# Patient Record
Sex: Female | Born: 1980 | Race: White | Hispanic: No | Marital: Single | Smoking: Former smoker
Health system: Southern US, Community
[De-identification: ages and names within clinical notes are randomized; demographics above are authoritative.]

## PROBLEM LIST (undated history)

## (undated) DIAGNOSIS — K219 Gastro-esophageal reflux disease without esophagitis: Secondary | ICD-10-CM

## (undated) DIAGNOSIS — K2970 Gastritis, unspecified, without bleeding: Secondary | ICD-10-CM

## (undated) DIAGNOSIS — Z87440 Personal history of urinary (tract) infections: Secondary | ICD-10-CM

## (undated) DIAGNOSIS — M549 Dorsalgia, unspecified: Secondary | ICD-10-CM

## (undated) DIAGNOSIS — B9681 Helicobacter pylori [H. pylori] as the cause of diseases classified elsewhere: Secondary | ICD-10-CM

## (undated) DIAGNOSIS — N879 Dysplasia of cervix uteri, unspecified: Secondary | ICD-10-CM

## (undated) DIAGNOSIS — O1490 Unspecified pre-eclampsia, unspecified trimester: Secondary | ICD-10-CM

## (undated) DIAGNOSIS — D649 Anemia, unspecified: Secondary | ICD-10-CM

## (undated) DIAGNOSIS — F419 Anxiety disorder, unspecified: Secondary | ICD-10-CM

## (undated) DIAGNOSIS — F329 Major depressive disorder, single episode, unspecified: Secondary | ICD-10-CM

## (undated) DIAGNOSIS — R0600 Dyspnea, unspecified: Secondary | ICD-10-CM

## (undated) DIAGNOSIS — F32A Depression, unspecified: Secondary | ICD-10-CM

## (undated) HISTORY — DX: Major depressive disorder, single episode, unspecified: F32.9

## (undated) HISTORY — PX: FRACTURE SURGERY: SHX138

## (undated) HISTORY — PX: APPENDECTOMY: SHX54

## (undated) HISTORY — DX: Depression, unspecified: F32.A

## (undated) HISTORY — PX: MOLE REMOVAL: SHX2046

---

## 2008-07-29 ENCOUNTER — Encounter (INDEPENDENT_AMBULATORY_CARE_PROVIDER_SITE_OTHER): Payer: Self-pay | Admitting: Pediatrics

## 2008-07-29 ENCOUNTER — Other Ambulatory Visit: Admission: RE | Admit: 2008-07-29 | Discharge: 2008-07-29 | Payer: Self-pay | Admitting: Pediatrics

## 2011-01-23 ENCOUNTER — Other Ambulatory Visit (HOSPITAL_COMMUNITY): Payer: Self-pay | Admitting: Pediatrics

## 2011-01-25 ENCOUNTER — Ambulatory Visit (HOSPITAL_COMMUNITY)
Admission: RE | Admit: 2011-01-25 | Discharge: 2011-01-25 | Disposition: A | Discharge status: Home / Self Care | Payer: Medicaid Other | Source: Ambulatory Visit | Attending: Pediatrics | Admitting: Pediatrics

## 2011-01-25 ENCOUNTER — Other Ambulatory Visit (HOSPITAL_COMMUNITY): Payer: Self-pay | Admitting: Pediatrics

## 2011-01-25 DIAGNOSIS — N63 Unspecified lump in unspecified breast: Secondary | ICD-10-CM | POA: Insufficient documentation

## 2011-08-07 ENCOUNTER — Other Ambulatory Visit (HOSPITAL_COMMUNITY): Payer: Self-pay | Admitting: Pediatrics

## 2011-08-07 DIAGNOSIS — Z09 Encounter for follow-up examination after completed treatment for conditions other than malignant neoplasm: Secondary | ICD-10-CM

## 2011-08-16 ENCOUNTER — Ambulatory Visit (HOSPITAL_COMMUNITY)
Admission: RE | Admit: 2011-08-16 | Discharge: 2011-08-16 | Disposition: A | Discharge status: Home / Self Care | Payer: Medicaid Other | Source: Ambulatory Visit | Attending: Pediatrics | Admitting: Pediatrics

## 2011-08-16 DIAGNOSIS — Z09 Encounter for follow-up examination after completed treatment for conditions other than malignant neoplasm: Secondary | ICD-10-CM

## 2011-08-16 DIAGNOSIS — N63 Unspecified lump in unspecified breast: Secondary | ICD-10-CM | POA: Insufficient documentation

## 2012-08-27 ENCOUNTER — Encounter: Payer: Self-pay | Admitting: Family Medicine

## 2012-11-13 ENCOUNTER — Encounter: Payer: Self-pay | Admitting: Family Medicine

## 2012-11-13 ENCOUNTER — Ambulatory Visit (INDEPENDENT_AMBULATORY_CARE_PROVIDER_SITE_OTHER): Payer: 59 | Admitting: Family Medicine

## 2012-11-13 VITALS — BP 112/70 | HR 89 | Temp 98.7°F | Ht 67.0 in | Wt 202.8 lb

## 2012-11-13 DIAGNOSIS — R350 Frequency of micturition: Secondary | ICD-10-CM

## 2012-11-13 LAB — POCT URINALYSIS DIPSTICK
Bilirubin, UA: NEGATIVE
Ketones, UA: NEGATIVE
Nitrite, UA: NEGATIVE
pH, UA: 7.5

## 2012-11-13 MED ORDER — PHENAZOPYRIDINE HCL 200 MG PO TABS: 200.0000 mg | ORAL_TABLET | Freq: Three times a day (TID) | ORAL | Status: DC | PRN

## 2012-11-13 MED ORDER — CEPHALEXIN 500 MG PO CAPS: 500.0000 mg | ORAL_CAPSULE | Freq: Two times a day (BID) | ORAL | Status: DC

## 2012-11-13 NOTE — Patient Instructions (Signed)
Urinary Tract Infection  Urinary tract infections (UTIs) can develop anywhere along your urinary tract. Your urinary tract is your body's drainage system for removing wastes and extra water. Your urinary tract includes two kidneys, two ureters, a bladder, and a urethra. Your kidneys are a pair of bean-shaped organs. Each kidney is about the size of your fist. They are located below your ribs, one on each side of your spine.  CAUSES  Infections are caused by microbes, which are microscopic organisms, including fungi, viruses, and bacteria. These organisms are so small that they can only be seen through a microscope. Bacteria are the microbes that most commonly cause UTIs.  SYMPTOMS   Symptoms of UTIs may vary by age and gender of the patient and by the location of the infection. Symptoms in young women typically include a frequent and intense urge to urinate and a painful, burning feeling in the bladder or urethra during urination. Older women and men are more likely to be tired, shaky, and weak and have muscle aches and abdominal pain. A fever may mean the infection is in your kidneys. Other symptoms of a kidney infection include pain in your back or sides below the ribs, nausea, and vomiting.  DIAGNOSIS  To diagnose a UTI, your caregiver will ask you about your symptoms. Your caregiver also will ask to provide a urine sample. The urine sample will be tested for bacteria and white blood cells. White blood cells are made by your body to help fight infection.  TREATMENT   Typically, UTIs can be treated with medication. Because most UTIs are caused by a bacterial infection, they usually can be treated with the use of antibiotics. The choice of antibiotic and length of treatment depend on your symptoms and the type of bacteria causing your infection.  HOME CARE INSTRUCTIONS   If you were prescribed antibiotics, take them exactly as your caregiver instructs you. Finish the medication even if you feel better after you  have only taken some of the medication.   Drink enough water and fluids to keep your urine clear or pale yellow.   Avoid caffeine, tea, and carbonated beverages. They tend to irritate your bladder.   Empty your bladder often. Avoid holding urine for long periods of time.   Empty your bladder before and after sexual intercourse.   After a bowel movement, women should cleanse from front to back. Use each tissue only once.  SEEK MEDICAL CARE IF:    You have back pain.   You develop a fever.   Your symptoms do not begin to resolve within 3 days.  SEEK IMMEDIATE MEDICAL CARE IF:    You have severe back pain or lower abdominal pain.   You develop chills.   You have nausea or vomiting.   You have continued burning or discomfort with urination.  MAKE SURE YOU:    Understand these instructions.   Will watch your condition.   Will get help right away if you are not doing well or get worse.  Document Released: 10/05/2004 Document Revised: 06/27/2011 Document Reviewed: 02/03/2011  [employer] Patient Information 2014 [employer].

## 2012-11-13 NOTE — Progress Notes (Signed)
  Subjective:    Patient ID: Kelli Calhoun, female    DOB: 1980-03-08, 32 y.o.   MRN: [id number]  HPI Pt here with buring, frequency, and urgency of urination since this morning. She does not have GI sx, fever, or back pain. No h/o other UTIs.   She does relay a concern that in the past she was told she had protein in her urine and was denied insurance once becuase of it but never found out if there was a problem. She has not had a PE for several years and would like one. She worries that she has not lost her baby weight. Of note, during her last pregnancy she had HELLP syndrome.     Review of Systems per hpi     Objective:   Physical Exam  Nursing note and vitals reviewed. Constitutional: She is oriented to person, place, and time. She appears well-developed and well-nourished.   Cardiovascular: Normal rate, regular rhythm and normal heart sounds.   Pulmonary/Chest: Effort normal and breath sounds normal.  Abdominal: Soft. Bowel sounds are normal. She exhibits no distension. There is no tenderness aside from mild spt. There is no rebound. no cvat. Skin: Skin is warm and dry.  Psychiatric: She has a normal mood and affect. Her behavior is normal.        Assessment & Plan:  Urinary frequency - Plan: POCT urinalysis dipstick, Urine culture, cephALEXin (KEFLEX) 500 MG capsule, phenazopyridine (PYRIDIUM) 200 MG tablet rtc 1 week cpe and f/u on the uti as well as check for proteinuria.

## 2012-11-15 LAB — URINE CULTURE

## 2012-11-22 ENCOUNTER — Ambulatory Visit (INDEPENDENT_AMBULATORY_CARE_PROVIDER_SITE_OTHER): Payer: 59 | Admitting: Family Medicine

## 2012-11-22 ENCOUNTER — Encounter: Payer: Self-pay | Admitting: Family Medicine

## 2012-11-22 VITALS — BP 108/70 | HR 93 | Temp 98.2°F | Wt 208.4 lb

## 2012-11-22 DIAGNOSIS — Z7251 High risk heterosexual behavior: Secondary | ICD-10-CM

## 2012-11-22 DIAGNOSIS — R809 Proteinuria, unspecified: Secondary | ICD-10-CM

## 2012-11-22 DIAGNOSIS — Z0000 Encounter for general adult medical examination without abnormal findings: Secondary | ICD-10-CM

## 2012-11-22 DIAGNOSIS — E663 Overweight: Secondary | ICD-10-CM

## 2012-11-22 LAB — POCT URINALYSIS DIPSTICK
Bilirubin, UA: NEGATIVE
Glucose, UA: NEGATIVE
Ketones, UA: NEGATIVE
Leukocytes, UA: NEGATIVE
Nitrite, UA: NEGATIVE

## 2012-11-22 LAB — COMPREHENSIVE METABOLIC PANEL
Albumin: 4.3 g/dL (ref 3.5–5.2)
Alkaline Phosphatase: 59 U/L (ref 39–117)
BUN: 13 mg/dL (ref 6–23)
Creat: 0.9 mg/dL (ref 0.50–1.10)
Glucose, Bld: 103 mg/dL — ABNORMAL HIGH (ref 70–99)
Potassium: 4.1 mEq/L (ref 3.5–5.3)
Total Bilirubin: 0.2 mg/dL — ABNORMAL LOW (ref 0.3–1.2)
Total Protein: 6.9 g/dL (ref 6.0–8.3)

## 2012-11-22 LAB — STD PANEL: HIV: NONREACTIVE

## 2012-11-22 LAB — HEMOGLOBIN A1C
Hgb A1c MFr Bld: 5.4 % (ref <5.7)
Mean Plasma Glucose: 108 mg/dL (ref <117)

## 2012-11-22 LAB — VITAMIN B12: Vitamin B-12: 434 pg/mL (ref 211–911)

## 2012-11-22 LAB — CBC WITH DIFFERENTIAL/PLATELET
Basophils Absolute: 0 10*3/uL (ref 0.0–0.1)
Basophils Relative: 1 % (ref 0–1)
Eosinophils Absolute: 0.1 10*3/uL (ref 0.0–0.7)
Eosinophils Relative: 1 % (ref 0–5)
HCT: 34.3 % — ABNORMAL LOW (ref 36.0–46.0)
Hemoglobin: 11.9 g/dL — ABNORMAL LOW (ref 12.0–15.0)
MCH: 29.5 pg (ref 26.0–34.0)
MCHC: 34.7 g/dL (ref 30.0–36.0)
MCV: 84.9 fL (ref 78.0–100.0)
Monocytes Absolute: 0.6 10*3/uL (ref 0.1–1.0)
Monocytes Relative: 9 % (ref 3–12)
Neutrophils Relative %: 56 % (ref 43–77)
Platelets: 238 10*3/uL (ref 150–400)

## 2012-11-22 LAB — LDL CHOLESTEROL, DIRECT: Direct LDL: 96 mg/dL

## 2012-11-22 NOTE — Progress Notes (Signed)
Subjective:    Patient ID: Kelli Calhoun, female    DOB: 1980-02-13, 32 y.o.   MRN: [id number]  HPI Pt here for cpe, pelvic with pap and breast exam, concern over new problem of possible carpal tunnel and f/u on history of proteinuria and bieng overweight.   # Health maint - pap - today - mammo/pert FH - h/o breast lump, has u/s and mammo every 6 mos, due now - physician breast exam - today, wnl - c-scope/pert FH - no FH - dexa - no h/o steroids or fractures - BP screen - BP at goal today - cholesterol screen - DUE as has not had cehcked - fasting glucose screen -  DUE as has not had checked, FH (dad) - chlamydia screen (if female under age 21) - pt requested STD checks today - tobacco - does not smoke, quit 5 ago - alcohol - couple drinks 2x/month socially - drugs - none - Hep C (born 20-1966) - not applicable - tdap (once over age 53 in place of DT) - not sure, [facility] [place] [facility] she [facility] pregnant - DT (every 10 years until age 63, once age 72+) - 56 y from tdap - flu - had thorugh work - pneumovax - not yet - zostavax (if over 60) - did have cp, will neet at age 48 - vit D - has not had checked  Carpal Tunnel Syndrome: Patient presents for presents evaluation of possible carpal tunnel syndrome.  Onset of the symptoms was several years ago. Current symptoms include pain involving the anterior aspect of the lateral hand, of moderate severity. Inciting event/aggravating factors: work related keyboarding and worse with activity Patient's course of [id number] worsening. Evaluation to date: none.  Treatment to date: wrist splints worn at night a few years ago, which helped. apap prn..  She has a h/o proteinuria but never had a workup. No known kidney disease. No edema.   Overweight - Pt concerned about being overweight. She says she has tried diets, but they dont work. She gets minimal exercise. She drinks "some" water but has a hard time quantifying  it. She has not made an eating plan regarding cooking vs fast/prepared food and healthy snacks. She is concerned because her daughter is also becoming overweight and she'd like both of them to lose weight and be in better shape.   Review of Systems per hpi     Objective:   Physical Exam Nursing note and vitals reviewed. Constitutional: She is oriented to person, place, and time. She appears well-developed and well-nourished. overweight HENT:  Right Ear: External ear normal.  Left Ear: External ear normal.  Nose: Nose normal.  Mouth/Throat: Oropharynx is clear and moist. No oropharyngeal exudate.  Eyes: Conjunctivae are normal. Pupils are equal, round, and reactive to light.  Neck: Normal range of motion. Neck supple. No thyromegaly present.  Cardiovascular: Normal rate, regular rhythm and normal heart sounds.   Pulmonary/Chest: Effort normal and breath sounds normal. breast exam wnl - no nipple discharge, lumps, lymphadenopathy, or other concerning findings Abdominal: Soft. Bowel sounds are normal. She exhibits no distension. There is no tenderness. There is no rebound.  Lymphadenopathy:    She has no cervical adenopathy.  Neurological: She is alert and oriented to person, place, and time. She has normal reflexes.  Skin: Skin is warm and dry.  Psychiatric: She has a normal mood and affect. Her behavior is normal.  Genital - vagina, uterus, cervic wnl, minimal  discharge, no ttp on bimanual         Assessment & Plan:   Annual physical exam - Plan: POCT urinalysis dipstick, PAP, Thin Prep w/HPV rflx HPV Type 16/18 ([facility]), Comprehensive metabolic panel, GC/chlamydia probe amp, urine, Hemoglobin A1c, STD Panel (HBSAG,HIV,RPR), TSH, Urine culture, Vit D  25 hydroxy (rtn osteoporosis monitoring), Wet prep, genital  Problems related to high-risk sexual behavior - Plan: CBC with Differential, Wet prep, genital, std panel, dna probe  Proteinuria - Plan: Comprehensive metabolic panel,  UA  Overweight - Plan: Hemoglobin A1c, TSH, Vitamin B12, LDL cholesterol, direct  Possible carpal tunnel - wrist braces  F/u 2 weeks

## 2012-11-22 NOTE — Patient Instructions (Signed)

## 2012-11-23 LAB — GC/CHLAMYDIA PROBE AMP, URINE
Chlamydia, Swab/Urine, PCR: NEGATIVE
GC Probe Amp, Urine: NEGATIVE

## 2012-11-23 LAB — URINE CULTURE: Organism ID, Bacteria: 9000

## 2012-11-26 LAB — PAP, THIN PREP W/HPV RFLX HPV TYPE 16/18: HPV DNA High Risk: DETECTED — AB

## 2012-11-29 ENCOUNTER — Emergency Department (HOSPITAL_COMMUNITY)
Admission: EM | Admit: 2012-11-29 | Discharge: 2012-11-29 | Disposition: A | Discharge status: Home / Self Care | Payer: 59 | Attending: Emergency Medicine | Admitting: Emergency Medicine

## 2012-11-29 ENCOUNTER — Encounter (HOSPITAL_COMMUNITY): Payer: Self-pay | Admitting: Emergency Medicine

## 2012-11-29 DIAGNOSIS — H5710 Ocular pain, unspecified eye: Secondary | ICD-10-CM | POA: Insufficient documentation

## 2012-11-29 DIAGNOSIS — Z87891 Personal history of nicotine dependence: Secondary | ICD-10-CM | POA: Insufficient documentation

## 2012-11-29 DIAGNOSIS — G43909 Migraine, unspecified, not intractable, without status migrainosus: Secondary | ICD-10-CM | POA: Insufficient documentation

## 2012-11-29 DIAGNOSIS — Z3202 Encounter for pregnancy test, result negative: Secondary | ICD-10-CM | POA: Insufficient documentation

## 2012-11-29 DIAGNOSIS — R42 Dizziness and giddiness: Secondary | ICD-10-CM | POA: Insufficient documentation

## 2012-11-29 DIAGNOSIS — Z79899 Other long term (current) drug therapy: Secondary | ICD-10-CM | POA: Insufficient documentation

## 2012-11-29 LAB — CBC WITH DIFFERENTIAL/PLATELET
Eosinophils Absolute: 0.1 10*3/uL (ref 0.0–0.7)
Eosinophils Relative: 1 % (ref 0–5)
HCT: 34.1 % — ABNORMAL LOW (ref 36.0–46.0)
Lymphocytes Relative: 26 % (ref 12–46)
Lymphs Abs: 1.8 10*3/uL (ref 0.7–4.0)
MCH: 29.5 pg (ref 26.0–34.0)
MCV: 87.4 fL (ref 78.0–100.0)
Monocytes Absolute: 0.5 10*3/uL (ref 0.1–1.0)
Neutrophils Relative %: 65 % (ref 43–77)
RBC: 3.9 MIL/uL (ref 3.87–5.11)
RDW: 12.2 % (ref 11.5–15.5)
WBC: 7 10*3/uL (ref 4.0–10.5)

## 2012-11-29 LAB — URINALYSIS, ROUTINE W REFLEX MICROSCOPIC
Bilirubin Urine: NEGATIVE
Glucose, UA: NEGATIVE mg/dL
Ketones, ur: NEGATIVE mg/dL
Leukocytes, UA: NEGATIVE
Protein, ur: NEGATIVE mg/dL
pH: 6.5 (ref 5.0–8.0)

## 2012-11-29 LAB — BASIC METABOLIC PANEL
CO2: 29 mEq/L (ref 19–32)
Calcium: 9.1 mg/dL (ref 8.4–10.5)
Creatinine, Ser: 0.96 mg/dL (ref 0.50–1.10)
GFR calc non Af Amer: 77 mL/min — ABNORMAL LOW (ref >90)
Glucose, Bld: 100 mg/dL — ABNORMAL HIGH (ref 70–99)

## 2012-11-29 LAB — POCT PREGNANCY, URINE: Preg Test, Ur: NEGATIVE

## 2012-11-29 MED ORDER — DIPHENHYDRAMINE HCL 50 MG/ML IJ SOLN
25.0000 mg | Freq: Once | INTRAMUSCULAR | Status: AC
  Administered 2012-11-29: 25 mg via INTRAVENOUS
  Filled 2012-11-29: qty 1

## 2012-11-29 MED ORDER — METOCLOPRAMIDE HCL 5 MG/ML IJ SOLN
10.0000 mg | Freq: Once | INTRAMUSCULAR | Status: AC
Start: 2012-11-29 — End: 2012-11-29
  Administered 2012-11-29: 10 mg via INTRAVENOUS
  Filled 2012-11-29: qty 2

## 2012-11-29 MED ORDER — KETOROLAC TROMETHAMINE 30 MG/ML IJ SOLN
30.0000 mg | Freq: Once | INTRAMUSCULAR | Status: AC
  Administered 2012-11-29: 30 mg via INTRAVENOUS
  Filled 2012-11-29: qty 1

## 2012-11-29 MED ORDER — SODIUM CHLORIDE 0.9 % IV SOLN: Freq: Once | INTRAVENOUS | Status: DC

## 2012-11-29 NOTE — ED Notes (Addendum)
Headache started 2 days ago in L temporal region, radiating to neck.  Denies photophobia, but states eyes "hurt", denies neck stiffness, fever, n/v.  Dizziness started today, states it is a lightheadedness. Has had migraines before and was on Lodine as a teen.  Migraines do not appear to be linked to hormonal fluctuations.  Has tried ibuprofen 400 - 600mg  , but it has not helped.  Last taken last night. Minimal vision impact.  Denies sinus congestion.

## 2012-11-29 NOTE — Discharge Instructions (Signed)
Headaches, Frequently Asked Questions °MIGRAINE HEADACHES °Q: What is migraine? What causes it? How can I treat it? °A: Generally, migraine headaches begin as a dull ache. Then they develop into a constant, throbbing, and pulsating pain. You may experience pain at the temples. You may experience pain at the front or back of one or both sides of the head. The pain is usually accompanied by a combination of: °· Nausea. °· Vomiting. °· Sensitivity to light and noise. °Some people (about 15%) experience an aura (see below) before an attack. The cause of migraine is believed to be chemical reactions in the brain. Treatment for migraine may include over-the-counter or prescription medications. It may also include self-help techniques. These include relaxation training and biofeedback.  °Q: What is an aura? °A: About 15% of people with migraine get an "aura". This is a sign of neurological symptoms that occur before a migraine headache. You may see wavy or jagged lines, dots, or flashing lights. You might experience tunnel vision or blind spots in one or both eyes. The aura can include visual or auditory hallucinations (something imagined). It may include disruptions in smell (such as strange odors), taste or touch. Other symptoms include: °· Numbness. °· A "pins and needles" sensation. °· Difficulty in recalling or speaking the correct word. °These neurological events may last as long as 60 minutes. These symptoms will fade as the headache begins. °Q: What is a trigger? °A: Certain physical or environmental factors can lead to or "trigger" a migraine. These include: °· Foods. °· Hormonal changes. °· Weather. °· Stress. °It is important to remember that triggers are different for everyone. To help prevent migraine attacks, you need to figure out which triggers affect you. Keep a headache diary. This is a good way to track triggers. The diary will help you talk to your healthcare professional about your condition. °Q: Does  weather affect migraines? °A: Bright sunshine, hot, humid conditions, and drastic changes in barometric pressure may lead to, or "trigger," a migraine attack in some people. But studies have shown that weather does not act as a trigger for everyone with migraines. °Q: What is the link between migraine and hormones? °A: Hormones start and regulate many of your body's functions. Hormones keep your body in balance within a constantly changing environment. The levels of hormones in your body are unbalanced at times. Examples are during menstruation, pregnancy, or menopause. That can lead to a migraine attack. In fact, about three quarters of all women with migraine report that their attacks are related to the menstrual cycle.  °Q: Is there an increased risk of stroke for migraine sufferers? °A: The likelihood of a migraine attack causing a stroke is very remote. That is not to say that migraine sufferers cannot have a stroke associated with their migraines. In persons under age 40, the most common associated factor for stroke is migraine headache. But over the course of a person's normal life span, the occurrence of migraine headache may actually be associated with a reduced risk of dying from cerebrovascular disease due to stroke.  °Q: What are acute medications for migraine? °A: Acute medications are used to treat the pain of the headache after it has started. Examples over-the-counter medications, NSAIDs, ergots, and triptans.  °Q: What are the triptans? °A: Triptans are the newest class of abortive medications. They are specifically targeted to treat migraine. Triptans are vasoconstrictors. They moderate some chemical reactions in the brain. The triptans work on receptors in your brain. Triptans help   to restore the balance of a neurotransmitter called serotonin. Fluctuations in levels of serotonin are thought to be a main cause of migraine.  °Q: Are over-the-counter medications for migraine effective? °A:  Over-the-counter, or "OTC," medications may be effective in relieving mild to moderate pain and associated symptoms of migraine. But you should see your caregiver before beginning any treatment regimen for migraine.  °Q: What are preventive medications for migraine? °A: Preventive medications for migraine are sometimes referred to as "prophylactic" treatments. They are used to reduce the frequency, severity, and length of migraine attacks. Examples of preventive medications include antiepileptic medications, antidepressants, beta-blockers, calcium channel blockers, and NSAIDs (nonsteroidal anti-inflammatory drugs). °Q: Why are anticonvulsants used to treat migraine? °A: During the past few years, there has been an increased interest in antiepileptic drugs for the prevention of migraine. They are sometimes referred to as "anticonvulsants". Both epilepsy and migraine may be caused by similar reactions in the brain.  °Q: Why are antidepressants used to treat migraine? °A: Antidepressants are typically used to treat people with depression. They may reduce migraine frequency by regulating chemical levels, such as serotonin, in the brain.  °Q: What alternative therapies are used to treat migraine? °A: The term "alternative therapies" is often used to describe treatments considered outside the scope of conventional Western medicine. Examples of alternative therapy include acupuncture, acupressure, and yoga. Another common alternative treatment is herbal therapy. Some herbs are believed to relieve headache pain. Always discuss alternative therapies with your caregiver before proceeding. Some herbal products contain arsenic and other toxins. °TENSION HEADACHES °Q: What is a tension-type headache? What causes it? How can I treat it? °A: Tension-type headaches occur randomly. They are often the result of temporary stress, anxiety, fatigue, or anger. Symptoms include soreness in your temples, a tightening band-like sensation  around your head (a "vice-like" ache). Symptoms can also include a pulling feeling, pressure sensations, and contracting head and neck muscles. The headache begins in your forehead, temples, or the back of your head and neck. Treatment for tension-type headache may include over-the-counter or prescription medications. Treatment may also include self-help techniques such as relaxation training and biofeedback. °CLUSTER HEADACHES °Q: What is a cluster headache? What causes it? How can I treat it? °A: Cluster headache gets its name because the attacks come in groups. The pain arrives with little, if any, warning. It is usually on one side of the head. A tearing or bloodshot eye and a runny nose on the same side of the headache may also accompany the pain. Cluster headaches are believed to be caused by chemical reactions in the brain. They have been described as the most severe and intense of any headache type. Treatment for cluster headache includes prescription medication and oxygen. °SINUS HEADACHES °Q: What is a sinus headache? What causes it? How can I treat it? °A: When a cavity in the bones of the face and skull (a sinus) becomes inflamed, the inflammation will cause localized pain. This condition is usually the result of an allergic reaction, a tumor, or an infection. If your headache is caused by a sinus blockage, such as an infection, you will probably have a fever. An x-ray will confirm a sinus blockage. Your caregiver's treatment might include antibiotics for the infection, as well as antihistamines or decongestants.  °REBOUND HEADACHES °Q: What is a rebound headache? What causes it? How can I treat it? °A: A pattern of taking acute headache medications too often can lead to a condition known as "rebound headache."   A pattern of taking too much headache medication includes taking it more than 2 days per week or in excessive amounts. That means more than the label or a caregiver advises. With rebound  headaches, your medications not only stop relieving pain, they actually begin to cause headaches. Doctors treat rebound headache by tapering the medication that is being overused. Sometimes your caregiver will gradually substitute a different type of treatment or medication. Stopping may be a challenge. Regularly overusing a medication increases the potential for serious side effects. Consult a caregiver if you regularly use headache medications more than 2 days per week or more than the label advises. °ADDITIONAL QUESTIONS AND ANSWERS °Q: What is biofeedback? °A: Biofeedback is a self-help treatment. Biofeedback uses special equipment to monitor your body's involuntary physical responses. Biofeedback monitors: °· Breathing. °· Pulse. °· Heart rate. °· Temperature. °· Muscle tension. °· Brain activity. °Biofeedback helps you refine and perfect your relaxation exercises. You learn to control the physical responses that are related to stress. Once the technique has been mastered, you do not need the equipment any more. °Q: Are headaches hereditary? °A: Four out of five (80%) of people that suffer report a family history of migraine. Scientists are not sure if this is genetic or a family predisposition. Despite the uncertainty, a child has a 50% chance of having migraine if one parent suffers. The child has a 75% chance if both parents suffer.  °Q: Can children get headaches? °A: By the time they reach high school, most young people have experienced some type of headache. Many safe and effective approaches or medications can prevent a headache from occurring or stop it after it has begun.  °Q: What type of doctor should I see to diagnose and treat my headache? °A: Start with your primary caregiver. Discuss his or her experience and approach to headaches. Discuss methods of classification, diagnosis, and treatment. Your caregiver may decide to recommend you to a headache specialist, depending upon your symptoms or other  physical conditions. Having diabetes, allergies, etc., may require a more comprehensive and inclusive approach to your headache. The [employer] will provide, upon request, a list of NHF physician members in your state. °Document Released: 03/18/2003 Document Revised: 03/20/2011 Document Reviewed: 08/26/2007 °[employer]® Patient Information ©2014 [employer]. ° °Migraine Headache °A migraine headache is very bad, throbbing pain on one or both sides of your head. Talk to your doctor about what things may bring on (trigger) your migraine headaches. °HOME CARE °· Only take medicines as told by your doctor. °· Lie down in a dark, quiet room when you have a migraine. °· Keep a journal to find out if certain things bring on migraine headaches. For example, write down: °· What you eat and drink. °· How much sleep you get. °· Any change to your diet or medicines. °· Lessen how much alcohol you drink. °· Quit smoking if you smoke. °· Get enough sleep. °· Lessen any stress in your life. °· Keep lights dim if bright lights bother you or make your migraines worse. °GET HELP RIGHT AWAY IF:  °· Your migraine becomes really bad. °· You have a fever. °· You have a stiff neck. °· You have trouble seeing. °· Your muscles are weak, or you lose muscle control. °· You lose your balance or have trouble walking. °· You feel like you will pass out (faint), or you pass out. °· You have really bad symptoms that are different than your first symptoms. °MAKE SURE   YOU:   Understand these instructions.  Will watch your condition.  Will get help right away if you are not doing well or get worse. Document Released: 10/05/2007 Document Revised: 03/20/2011 Document Reviewed: 12/16/2010 [facility] Patient Information 2014 [place], [place].

## 2012-11-29 NOTE — ED Provider Notes (Signed)
CSN: [id number]     Arrival date & time 11/29/12  4098 History   First MD Initiated Contact with Patient 11/29/12 [phone]     Chief Complaint  Patient presents with  . Migraine  . Dizziness   (Consider location/radiation/quality/duration/timing/severity/associated sxs/prior Treatment) Patient is a 32 y.o. female presenting with headaches. The history is provided by the patient.  Headache Pain location:  L parietal, L temporal and frontal Quality:  Dull Radiates to:  L neck Severity at highest:  9/10 Onset quality:  Gradual Duration:  2 days Timing:  Constant Progression:  Unchanged Chronicity:  Recurrent Similar to prior headaches: yes   Context: not activity, not exposure to bright light, not stress and not intercourse   Relieved by:  Nothing Worsened by:  Activity Ineffective treatments:  NSAIDs Associated symptoms: dizziness and eye pain   Associated symptoms: no abdominal pain, no back pain, no blurred vision, no congestion, no cough, no drainage, no facial pain, no fatigue, no fever, no focal weakness, no hearing loss, no loss of balance, no nausea, no near-syncope, no neck pain, no neck stiffness, no numbness, no paresthesias, no photophobia, no seizures, no sinus pressure, no sore throat, no swollen glands, no syncope, no URI, no visual change, no vomiting and no weakness   Risk factors: no family hx of SAH     History reviewed. No pertinent past medical history. Past Surgical History  Procedure Laterality Date  . Appendectomy    . Fracture surgery    . Mole removal     History reviewed. No pertinent family history. History  Substance Use Topics  . Smoking status: Former Games developer  . Smokeless tobacco: Not on file  . Alcohol Use: Yes     Comment: socially   OB History   Grav Para Term Preterm Abortions TAB SAB Ect Mult Living                 Review of Systems  Constitutional: Negative for fever, activity change, appetite change and fatigue.  HENT: Negative for  congestion, facial swelling, hearing loss, postnasal drip, sinus pressure, sore throat and trouble swallowing.   Eyes: Positive for pain. Negative for blurred vision, photophobia and visual disturbance.  Respiratory: Negative for cough and shortness of breath.   Cardiovascular: Negative for chest pain, syncope and near-syncope.  Gastrointestinal: Negative for nausea, vomiting and abdominal pain.  Musculoskeletal: Negative for back pain, neck pain and neck stiffness.  Skin: Negative for rash and wound.  Neurological: Positive for dizziness and headaches. Negative for focal weakness, seizures, facial asymmetry, speech difficulty, weakness, numbness, paresthesias and loss of balance.  Psychiatric/Behavioral: Negative for confusion and decreased concentration.  All other systems reviewed and are negative.    Allergies  Review of patient's allergies indicates no known allergies.  Home Medications   Current Outpatient Rx  Name  Route  Sig  Dispense  Refill  . ibuprofen (ADVIL,MOTRIN) 200 MG tablet   Oral   Take 600 mg by mouth daily as needed for mild pain or moderate pain.         . methylphenidate (RITALIN) 10 MG tablet   Oral   Take 10 mg by mouth 2 (two) times daily.          BP 136/77  Pulse 77  Temp(Src) 98.3 F (36.8 C) (Oral)  Resp 18  Ht 5\' 7"  (1.702 m)  Wt 206 lb (93.441 kg)  BMI 32.26 kg/m2  SpO2 100%  LMP 11/25/2012 Physical Exam  Nursing note and  vitals reviewed. Constitutional: She is oriented to person, place, and time. She appears well-developed and well-nourished. No distress.  HENT:  Head: Normocephalic and atraumatic.  Mouth/Throat: Oropharynx is clear and moist.  Eyes: Conjunctivae and EOM are normal. Pupils are equal, round, and reactive to light.  Neck: Normal range of motion and phonation normal. Neck supple. No spinous process tenderness and no muscular tenderness present. No rigidity. No Brudzinski's sign and no Kernig's sign noted. No thyromegaly  present.  Cardiovascular: Normal rate, regular rhythm, normal heart sounds and intact distal pulses.   No murmur heard. Pulmonary/Chest: Effort normal and breath sounds normal. No respiratory distress.  Musculoskeletal: Normal range of motion.  Lymphadenopathy:    She has no cervical adenopathy.  Neurological: She is alert and oriented to person, place, and time. She has normal strength. No cranial nerve deficit or sensory deficit. She exhibits normal muscle tone. Coordination and gait normal. GCS eye subscore is 4. GCS verbal subscore is 5. GCS motor subscore is 6.  Reflex Scores:      Tricep reflexes are 2+ on the right side and 2+ on the left side.      Bicep reflexes are 2+ on the right side and 2+ on the left side.      Patellar reflexes are 2+ on the right side and 2+ on the left side.      Achilles reflexes are 2+ on the right side and 2+ on the left side. Skin: Skin is warm and dry.  Psychiatric: She has a normal mood and affect.    ED Course  Procedures (including critical care time) Labs Review Labs Reviewed  CBC WITH DIFFERENTIAL - Abnormal; Notable for the following:    Hemoglobin 11.5 (*)    HCT 34.1 (*)    All other components within normal limits  BASIC METABOLIC PANEL - Abnormal; Notable for the following:    Potassium 3.4 (*)    Glucose, Bld 100 (*)    GFR calc non Af Amer 77 (*)    GFR calc Af Amer 89 (*)    All other components within normal limits  URINALYSIS, ROUTINE W REFLEX MICROSCOPIC - Abnormal; Notable for the following:    Hgb urine dipstick TRACE (*)    All other components within normal limits  URINE MICROSCOPIC-ADD ON  POCT PREGNANCY, URINE   Imaging Review No results found.  EKG Interpretation   None       MDM   Vitals stable,  Pt is non-toxic appearing.  No focal neuro deficits, no meningeal signs.  Headache of gradual onset that is similar to previous.    Labs reviewed and discussed with the patient.  She is feeling better and reports  that the headache and dizziness have completely resolved.  She is ambulatory with a steady gait and requesting discharge  Patient agrees to close f/u with PMD or to return here if the symptoms worsen.  Appears stable for discharge  Winifred Balogh L. Trisha Mangle, PA-C 11/30/12 1514

## 2012-12-01 NOTE — ED Provider Notes (Signed)
Medical screening examination/treatment/procedure(s) were performed by non-physician practitioner and as supervising physician I was immediately available for consultation/collaboration.  EKG Interpretation   None         [name] L Manish Ruggiero, MD 12/01/12 0716 

## 2012-12-04 ENCOUNTER — Ambulatory Visit (INDEPENDENT_AMBULATORY_CARE_PROVIDER_SITE_OTHER): Payer: 59 | Admitting: Family Medicine

## 2012-12-04 ENCOUNTER — Encounter: Payer: Self-pay | Admitting: Family Medicine

## 2012-12-04 VITALS — BP 112/68 | HR 88 | Temp 97.3°F | Resp 20 | Ht 65.0 in | Wt 203.0 lb

## 2012-12-04 DIAGNOSIS — G5600 Carpal tunnel syndrome, unspecified upper limb: Secondary | ICD-10-CM

## 2012-12-04 DIAGNOSIS — D649 Anemia, unspecified: Secondary | ICD-10-CM

## 2012-12-04 DIAGNOSIS — Z87448 Personal history of other diseases of urinary system: Secondary | ICD-10-CM

## 2012-12-04 DIAGNOSIS — IMO0002 Reserved for concepts with insufficient information to code with codable children: Secondary | ICD-10-CM

## 2012-12-04 DIAGNOSIS — E663 Overweight: Secondary | ICD-10-CM

## 2012-12-04 DIAGNOSIS — L989 Disorder of the skin and subcutaneous tissue, unspecified: Secondary | ICD-10-CM

## 2012-12-04 DIAGNOSIS — B977 Papillomavirus as the cause of diseases classified elsewhere: Secondary | ICD-10-CM

## 2012-12-04 DIAGNOSIS — N920 Excessive and frequent menstruation with regular cycle: Secondary | ICD-10-CM

## 2012-12-04 DIAGNOSIS — R6889 Other general symptoms and signs: Secondary | ICD-10-CM

## 2012-12-04 MED ORDER — NORGESTIM-ETH ESTRAD TRIPHASIC 0.18/0.215/0.25 MG-35 MCG PO TABS: 1.0000 | ORAL_TABLET | Freq: Every day | ORAL | Status: DC

## 2012-12-04 NOTE — Progress Notes (Signed)
Subjective:    Patient ID: Kelli Calhoun, female    DOB: [birth year], 32 y.o.   MRN: [id number]  HPI Pt here for 3 chronic issues, 1 now resolved issue, and skin biopsies.  Anemia - seen on cbc, has felt fatigued recemtly. Has had heavier periods since her 3rd child, more bleeding, more cramps. She had this before kids but was on ocps which helped. Now her bf has had a vasectomy so she has not been on [facility].   Carpal tunnel - braces have not helped. Still weakness and pain in hands and forearms.   Overweight - working on diet and exercise, plans to be more aggressive with this after the holidays.   Proteinuria - resolved  Concerning skin lesions - 1 skin tag on chest, irritates her, gets cuaght on clothing and bleeds. She requests removal - 4 concerning skin lesions on abdominal area - none irritating or itching but she thinks all have enlarged and edges have become blurrier.   Review of Systems     Objective:   Physical Exam Nursing note and vitals reviewed. Constitutional: She is oriented to person, place, and time. She appears well-developed and well-nourished.  HENT:  Right Ear: External ear normal.  Left Ear: External ear normal.  Nose: Nose normal.  Mouth/Throat: Oropharynx is clear and moist. No oropharyngeal exudate.  Eyes: Conjunctivae are normal. Pupils are equal, round, and reactive to light.  Neck: Normal range of motion. Neck supple. No thyromegaly present.  Cardiovascular: Normal rate, regular rhythm and normal heart sounds.   Pulmonary/Chest: Effort normal and breath sounds normal.  Abdominal: Soft. Bowel sounds are normal. She exhibits no distension. There is no tenderness. There is no rebound.  Lymphadenopathy:    She has no cervical adenopathy.  Neurological: She is alert and oriented to person, place, and time. She has normal reflexes. phalens and tinnels neg but some flattening of thenar eminance noted on left hand.  Skin: Skin is warm and dry. Skin tag - 5mm long,  dry blood on it, on chest Skin lesion 1 - blurred edges, 4mm diameter, rasied center, variations in darkness, right hip Skin lesion 2 -  blurred edges, 6mm diameter, rasied center, variations in darkness, right lower abd Skin lesion 3 -  blurred edges, 7mm diameter, rasied center, variations in darkness, left lower abd Skin lesion 4 -   blurred edges, 4mm diameter, rasied center, variations in darkness, left hip Psychiatric: She has a normal mood and affect. Her behavior is normal.         Assessment & Plan:  Anemia - likely due to heavy periods, restart ocps and start iron, recheck in 6 most  Carpal tunnel - see hand surg, referral placed  Overweight - pt to work on diet and exercise on her own once holidays are through and rtc in 6 mos for follow up Dietary management, education, guidance, and counseling provided  hpv on pap - re-pap in 1 year.   Concerning skin lesions -  4 punch biopsies and 1 skin tag removal  Skin Tag Removal Procedure Note  Pre-operative Diagnosis: Classic skin tags (acrochordon)  Post-operative Diagnosis: Classic skin tags (acrochordon)  Locations:left chest  Indications: irritation, bleeding  Anesthesia: Lidocaine 1% with epinephrine with added sodium bicarbonate  Procedure Details  The risks (including bleeding and infection) and benefits of the procedure and Verbal informed consent obtained. Using sterile iris scissors, multiple skin tags were snipped off at their bases after cleansing with Betadine.  Bleeding was controlled by  pressure.   Findings: Pathognomonic benign lesions  not sent for pathological exam.  Condition: Stable  Complications: none.  Plan: 1. Instructed to keep the wounds dry and covered for 24-48h and clean thereafter. 2. Warning signs of infection were reviewed.   3. Recommended that the patient use OTC acetaminophen as needed for pain.  4. Return as needed.  Punch Biopsy Procedure Note  Pre-operative Diagnosis:  Dysplastic nevi, Suspicious lesion  Post-operative Diagnosis: same  Locations: right hip, llq, rlq, left hip  Indications: r/o skin cancer  Anesthesia: Lidocaine 1% with epinephrine with added sodium bicarbonate  Procedure Details  History of allergy to iodine: no Patient informed of the risks (including bleeding and infection) and benefits of the  procedure and Verbal informed consent obtained.  The lesions and surrounding areas were given a sterile prep using betadyne and draped in the usual sterile fashion. The skin was then stretched perpendicular to the skin tension lines and the lesion removed using the 3 mm punch. The resulting ellipse was then closed.  The specimens were sent for pathologic examination. The patient tolerated the procedure well.  EBL: <2cc total ml  Findings: none  Condition: Stable  Complications: none.  Plan: 1. Instructed to keep the wound dry and covered for 24-48h and clean thereafter. 2. Warning signs of infection were reviewed.   3. Recommended that the patient use OTC acetaminophen as needed for pain.

## 2012-12-04 NOTE — Patient Instructions (Signed)
Anemia - start iron daily, try ferrous gluconate 1-2 times daily  Biopsy Care After Refer to this sheet in the next few weeks. These instructions provide you with information on caring for yourself after your procedure. Your caregiver may also give you more specific instructions. Your treatment has been planned according to current medical practices, but problems sometimes occur. Call your caregiver if you have any problems or questions after your procedure. If you had a fine needle biopsy, you may have soreness at the biopsy site for 1 to 2 days. If you had an open biopsy, you may have soreness at the biopsy site for 3 to 4 days. HOME CARE INSTRUCTIONS   You may resume normal diet and activities as directed.  Change bandages (dressings) as directed. If your wound was closed with a skin glue (adhesive), it will wear off and begin to peel in 7 days.  Only take over-the-counter or prescription medicines for pain, discomfort, or fever as directed by your caregiver.  Ask your caregiver when you can bathe and get your wound wet. SEEK IMMEDIATE MEDICAL CARE IF:   You have increased bleeding (more than a small spot) from the biopsy site.  You notice redness, swelling, or increasing pain at the biopsy site.  You have pus coming from the biopsy site.  You have a fever.  You notice a bad smell coming from the biopsy site or dressing.  You have a rash, have difficulty breathing, or have any allergic problems. MAKE SURE YOU:   Understand these instructions.  Will watch your condition.  Will get help right away if you are not doing well or get worse. Document Released: 07/15/2004 Document Revised: 03/20/2011 Document Reviewed: 06/23/2010 [facility] Patient Information 2014 [place], [place].

## 2012-12-04 NOTE — Addendum Note (Signed)
Addended by: Acey Lav on: 12/04/2012 04:16 PM   Modules accepted: Orders

## 2012-12-11 ENCOUNTER — Telehealth: Payer: Self-pay | Admitting: *Deleted

## 2012-12-11 NOTE — Telephone Encounter (Signed)
Message copied by [facility] [place] [facility] on Wed Dec 11, 2012  4:16 PM ------      Message from: Acey Lav      Created: Wed Dec 11, 2012  1:36 PM       Please let pt know her labs came back. We'll need to repeat her pap in 1 year due to hpv positive and she is slightly anemic. Does she have heavy periods? Thanks AW ------

## 2012-12-11 NOTE — Telephone Encounter (Signed)
Nurse notified pt and she stated that she was already aware of results that MD had discussed those with her in office and that she does have heavy periods. Will route to MD.

## 2012-12-18 ENCOUNTER — Encounter: Payer: Self-pay | Admitting: Family Medicine

## 2012-12-18 ENCOUNTER — Ambulatory Visit (INDEPENDENT_AMBULATORY_CARE_PROVIDER_SITE_OTHER): Payer: 59 | Admitting: Family Medicine

## 2012-12-18 VITALS — BP 104/68 | HR 83 | Temp 98.2°F | Resp 20 | Ht 65.0 in | Wt 203.2 lb

## 2012-12-18 DIAGNOSIS — G459 Transient cerebral ischemic attack, unspecified: Secondary | ICD-10-CM

## 2012-12-18 DIAGNOSIS — G5600 Carpal tunnel syndrome, unspecified upper limb: Secondary | ICD-10-CM

## 2012-12-18 DIAGNOSIS — G2581 Restless legs syndrome: Secondary | ICD-10-CM | POA: Insufficient documentation

## 2012-12-18 DIAGNOSIS — E663 Overweight: Secondary | ICD-10-CM | POA: Insufficient documentation

## 2012-12-18 DIAGNOSIS — N920 Excessive and frequent menstruation with regular cycle: Secondary | ICD-10-CM

## 2012-12-18 NOTE — Patient Instructions (Signed)

## 2012-12-18 NOTE — Progress Notes (Signed)
Subjective:    Patient ID: Kelli Calhoun, female    DOB: 1980/07/01, 32 y.o.   MRN: [id number]  HPI Kelli Calhoun is here today for f/u on a few chronic concerns as well as to discuss a new concern of possible TIA.  Overweight - is trying to maintain weight throughout holidays. After holidays will work on losing weight.   Carpel tunnel - has been referred to hand surgeon for eval but hasnt had appt schedule yet.  Heavy periods - mild anemia, hasnt started OCPs yet bc wants to wait and start on Sunday after her period.   RLS - not on any meds, but she notices esp at night her legs need to move.  Pt had an episode of complete disorientation 1 week ago that she is woried about. She was stocking a shelf at work and when she got something off a cart and went to put it on the shelf, and suddenly realized her orientation was the wrong way. For a few seconds she was completely confused where things were.  She also had tunnel vision which "zoomed in" where she was looking. She denies dizziness, weakness, palpittions, pain, or new foods/routine. This occurred at 3am which is a normal time for the pt to be working. She does have a history of headaches and a FH of stroke - dad had several strokes starting around age 36. He had had HTN, was diabetic and smoked.   Review of Systems per hpi     Objective:   Physical Exam  Neurological: She is alert. She has normal strength and normal reflexes. No cranial nerve deficit or sensory deficit. She displays a negative Romberg sign. Coordination and gait normal. GCS eye subscore is 4. GCS verbal subscore is 5. GCS motor subscore is 6.  Rapid alternating movements, finger nose, heel-toe walk all wnl     Nursing note and vitals reviewed. Constitutional: She is oriented to person, place, and time. She appears well-developed and well-nourished.  HENT:  Right Ear: External ear normal.  Left Ear: External ear normal.  Nose: Nose normal.  Mouth/Throat: Oropharynx is  clear and moist. No oropharyngeal exudate.  Eyes: Conjunctivae are normal. Pupils are equal, round, and reactive to light.  Neck: Normal range of motion. Neck supple. No thyromegaly present.  Cardiovascular: Normal rate, regular rhythm and normal heart sounds.   Pulmonary/Chest: Effort normal and breath sounds normal.  Abdominal: Soft. Bowel sounds are normal. She exhibits no distension. There is no tenderness. There is no rebound.  Lymphadenopathy:    She has no cervical adenopathy.  Neurological: She is alert and oriented to person, place, and time. She has normal reflexes.  Skin: Skin is warm and dry.  Psychiatric: She has a normal mood and affect. Her behavior is normal.        Assessment & Plan:  TIA (transient ischemic attack) - Plan: US Carotid Duplex Bilateral, EKG 12-Lead, MR Brain Wo Contrast - pt said she did not have time for ecg, will try to get it done at [facility] tomorrow along with other studies and if to able she can rtc tomorrow and we will do ecg. - if ecg wnl will need holter monitor  Overweight - ok to wait till after holidays  Restless leg syndrome - start ferrous gluconate, suspect related to iron deficiency due to anemia from heavy periods  Carpal tunnel syndrome, unspecified laterality - will sort out referral and make sure she gets set up with appt  Heavy periods - OCPs,  iron supplements  If new neuro sx arise she should go to the ED. Will see how these studies look and go fro there. Otherwise rtc 3 mos.

## 2012-12-20 ENCOUNTER — Ambulatory Visit (HOSPITAL_COMMUNITY)
Admission: RE | Admit: 2012-12-20 | Discharge: 2012-12-20 | Disposition: A | Discharge status: Home / Self Care | Payer: 59 | Source: Ambulatory Visit | Attending: Family Medicine | Admitting: Family Medicine

## 2012-12-20 ENCOUNTER — Ambulatory Visit (HOSPITAL_COMMUNITY): Payer: 59

## 2012-12-20 DIAGNOSIS — G459 Transient cerebral ischemic attack, unspecified: Secondary | ICD-10-CM | POA: Insufficient documentation

## 2012-12-20 DIAGNOSIS — Q761 Klippel-Feil syndrome: Secondary | ICD-10-CM | POA: Insufficient documentation

## 2012-12-25 ENCOUNTER — Ambulatory Visit (INDEPENDENT_AMBULATORY_CARE_PROVIDER_SITE_OTHER): Payer: 59 | Admitting: Family Medicine

## 2012-12-25 ENCOUNTER — Other Ambulatory Visit: Payer: Self-pay | Admitting: Family Medicine

## 2012-12-25 ENCOUNTER — Telehealth: Payer: Self-pay | Admitting: *Deleted

## 2012-12-25 DIAGNOSIS — N63 Unspecified lump in unspecified breast: Secondary | ICD-10-CM

## 2012-12-25 DIAGNOSIS — Z09 Encounter for follow-up examination after completed treatment for conditions other than malignant neoplasm: Secondary | ICD-10-CM

## 2012-12-25 DIAGNOSIS — G459 Transient cerebral ischemic attack, unspecified: Secondary | ICD-10-CM

## 2012-12-25 DIAGNOSIS — Z0189 Encounter for other specified special examinations: Secondary | ICD-10-CM

## 2012-12-25 NOTE — Telephone Encounter (Signed)
Pt notified and appreciative. Informed her that we would notify her with an update on prior authorizations and when CT would be scheduled.

## 2012-12-25 NOTE — Telephone Encounter (Signed)
Message copied by [facility], [place] [facility] on Wed Dec 25, 2012 11:30 AM ------      Message from: Acey Lav      Created: Wed Dec 25, 2012 11:11 AM       Please let pt know mri showed no stroke, no evidence of bleeding, no masses (ie no tumor). All great news. It did show some slight abnormalities in the top of her cervical spine that probably are not a problem but should be evaluated further. We'll do this with a CT which Im ordering now. Ill let her know the result when I get it. For now she shouldn't worry - these abnormalities have likley been there her whole life and we only now are seeing them bc we did imaging for another reason. Thanks AW ------

## 2012-12-25 NOTE — Progress Notes (Signed)
See telephone encounter.

## 2012-12-26 NOTE — Progress Notes (Signed)
Pt came in for ordered EKG. EKG done and results given to MD.

## 2013-01-08 ENCOUNTER — Ambulatory Visit: Payer: 59 | Admitting: Orthopedic Surgery

## 2013-01-15 ENCOUNTER — Telehealth: Payer: Self-pay | Admitting: Family Medicine

## 2013-01-15 ENCOUNTER — Ambulatory Visit (HOSPITAL_COMMUNITY)
Admission: RE | Admit: 2013-01-15 | Discharge: 2013-01-15 | Disposition: A | Discharge status: Home / Self Care | Payer: 59 | Source: Ambulatory Visit | Attending: Family Medicine | Admitting: Family Medicine

## 2013-01-15 DIAGNOSIS — R928 Other abnormal and inconclusive findings on diagnostic imaging of breast: Secondary | ICD-10-CM | POA: Insufficient documentation

## 2013-01-15 DIAGNOSIS — Z09 Encounter for follow-up examination after completed treatment for conditions other than malignant neoplasm: Secondary | ICD-10-CM

## 2013-01-15 DIAGNOSIS — N63 Unspecified lump in unspecified breast: Secondary | ICD-10-CM

## 2013-01-15 NOTE — Telephone Encounter (Signed)
Pt came in wanting results of Pathology's for Bx.'s  [phone]

## 2013-01-16 ENCOUNTER — Ambulatory Visit (INDEPENDENT_AMBULATORY_CARE_PROVIDER_SITE_OTHER): Payer: 59 | Admitting: Orthopedic Surgery

## 2013-01-16 ENCOUNTER — Encounter: Payer: Self-pay | Admitting: Orthopedic Surgery

## 2013-01-16 VITALS — BP 112/77 | Ht 65.0 in | Wt 203.0 lb

## 2013-01-16 DIAGNOSIS — G5600 Carpal tunnel syndrome, unspecified upper limb: Secondary | ICD-10-CM

## 2013-01-16 MED ORDER — VITAMIN B-6 100 MG PO TABS: 100.0000 mg | ORAL_TABLET | Freq: Two times a day (BID) | ORAL | Status: DC

## 2013-01-16 MED ORDER — GABAPENTIN 100 MG PO CAPS: 100.0000 mg | ORAL_CAPSULE | Freq: Three times a day (TID) | ORAL | Status: DC

## 2013-01-16 NOTE — Patient Instructions (Addendum)
Referral to Dr. Gerilyn Pilgrimoonquah for nerve conduction study Pick up your prescriptions from the pharmacy

## 2013-01-16 NOTE — Progress Notes (Signed)
Patient ID: Kelli Calhoun, female   DOB: 1980-08-19, 33 y.o.   MRN: [id number]  Chief Complaint  Patient presents with  . Hand Pain    Bilateral hand pain and numbness. [facility] from [facility]   HISTORY: This is a 33 year old female who presents to us with a history of bilateral numbness and tingling right greater than left upper extremity with no history of diabetes smoking or thyroid disease. Previous treatment includes alternate use of a wrist splint at night.  She complains of pain in the wrist which radiates to the elbow and numbness and tingling from the wrist down into the fingers of the right and left hand. She also complains of some mild left-sided neck pain occasional stiffness with recent CT scan showing Klippel-Feil syndrome.   Vital signs are stable as recorded  General appearance is normal, body habitus endomorphic  The patient is alert and oriented x 3  The patient's mood and affect are normal  Gait assessment: Noncontributory normal  The cardiovascular exam reveals normal pulses and temperature without edema or  swelling.  The lymphatic system is negative for palpable lymph nodes  The sensory exam is normal.  There are no pathologic reflexes.  Balance is normal.   Exam of the upper extremities  Reflexes are equal and normal inspection no swelling no tenderness hand and wrist range of motion is normal the stability is normal grip strength is normal skin is normal no rash no lacerations  The pinprick test is normal bilaterally   She most likely has carpal tunnel syndrome the Klippel-Feil syndrome may be a red herring or maybe contributed to her upper extremity numbness and tingling  She's not been fully medically managed so we will start gabapentin 100 mg 3 times a day, vitamin D 600 mg twice a day and get I nerve conduction test. She comes back I would like to review her CT scan of her cervical spine

## 2013-01-20 ENCOUNTER — Other Ambulatory Visit: Payer: Self-pay | Admitting: *Deleted

## 2013-01-20 ENCOUNTER — Telehealth: Payer: Self-pay | Admitting: *Deleted

## 2013-01-20 DIAGNOSIS — G5600 Carpal tunnel syndrome, unspecified upper limb: Secondary | ICD-10-CM

## 2013-01-20 NOTE — Telephone Encounter (Signed)
Referral and office notes faxed to DR. [place]. NPI # [id number] received from Tanya at [facility] for the referral. Awaiting appointment.

## 2013-01-22 ENCOUNTER — Telehealth: Payer: Self-pay | Admitting: *Deleted

## 2013-01-22 NOTE — Telephone Encounter (Signed)
Spoke with representative with [facility] and she stated the only lab results they had there were from 11/22/2012 but had nothing from 12/04/2012 and had no results or tests showing for any pathology. She stated that she could not answer as to what happened with samples. Will route to MD

## 2013-01-23 NOTE — Telephone Encounter (Signed)
Thanks April; I will call and try to speak with a supervisor.

## 2013-01-23 NOTE — Telephone Encounter (Signed)
Spoke with pt, she was understanding. She questioned a pre-auth for CT scan, explained that I do not handle that but would see what I could find out.

## 2013-01-23 NOTE — Telephone Encounter (Signed)
Pre Berkley Harveyauth has been done. [facility]

## 2013-01-23 NOTE — Telephone Encounter (Signed)
Will have to speak with [facility], their number is [phone] our account number is [id number]. There are different supervisors so best for you to call at your convenience and get the one that is on duty.

## 2013-01-23 NOTE — Telephone Encounter (Signed)
This is absolutely unacceptable on their part. Please find out who the supervisor is so I can speak with him/her directly. Please let the patient know, with our extreme appologies and scheule a time for her to come in at no cost so she and i can make a plan going forward. Thanks AW

## 2013-01-28 NOTE — Telephone Encounter (Signed)
Appointment with Dr. Gerilyn Pilgrimoonquah is 02/19/13 at 11:00 am for NCS

## 2013-01-29 ENCOUNTER — Ambulatory Visit (HOSPITAL_COMMUNITY)
Admission: RE | Admit: 2013-01-29 | Discharge: 2013-01-29 | Disposition: A | Discharge status: Home / Self Care | Payer: 59 | Source: Ambulatory Visit | Attending: Family Medicine | Admitting: Family Medicine

## 2013-01-29 DIAGNOSIS — M542 Cervicalgia: Secondary | ICD-10-CM | POA: Insufficient documentation

## 2013-01-29 DIAGNOSIS — Q7649 Other congenital malformations of spine, not associated with scoliosis: Secondary | ICD-10-CM | POA: Insufficient documentation

## 2013-01-29 DIAGNOSIS — G459 Transient cerebral ischemic attack, unspecified: Secondary | ICD-10-CM

## 2013-04-02 ENCOUNTER — Ambulatory Visit (INDEPENDENT_AMBULATORY_CARE_PROVIDER_SITE_OTHER): Payer: 59 | Admitting: Family Medicine

## 2013-04-02 ENCOUNTER — Encounter: Payer: Self-pay | Admitting: Family Medicine

## 2013-04-02 VITALS — BP 128/80 | HR 76 | Temp 98.1°F | Resp 20 | Ht 66.0 in | Wt 208.6 lb

## 2013-04-02 DIAGNOSIS — J309 Allergic rhinitis, unspecified: Secondary | ICD-10-CM

## 2013-04-02 DIAGNOSIS — J302 Other seasonal allergic rhinitis: Secondary | ICD-10-CM

## 2013-04-02 DIAGNOSIS — L989 Disorder of the skin and subcutaneous tissue, unspecified: Secondary | ICD-10-CM

## 2013-04-02 MED ORDER — FLUTICASONE PROPIONATE 50 MCG/ACT NA SUSP: NASAL | Status: DC

## 2013-04-02 MED ORDER — CETIRIZINE HCL 10 MG PO TABS: 10.0000 mg | ORAL_TABLET | Freq: Every day | ORAL | Status: DC

## 2013-04-02 NOTE — Patient Instructions (Signed)

## 2013-04-02 NOTE — Progress Notes (Signed)
   Subjective:    Patient ID: Rennis GoldenMegan J Jones, female    DOB: 1980/10/07, 33 y.o.   MRN: [id number]  HPI Pt here to f/u on several issues:  1 - skin bx x 5 done in November, apparently lost by lab as we know they left our office but the lab never resulted them. Pt understandably very frustrated. Most concerning is that she has a h/o melanoma per her (no records seen to verify details).   2 - weight - pt overweight, would like a "weight loss plan". Says she is not doing any diet or exercise modifications at this time.   3 - vision change - 1 month ago had episode when driving of blurry vision in right eye. Describes it as blurry around the enture edge but not in the center. No other sx. See notes for prior tia sx. W/u neg. Vision sx resolved after 20-30 minutes.   4 - chronicc seasonal allergies, worst involving the nose.   Review of Systems A 12 point review of systems is negative except as per hpi.       Objective:   Physical Exam Nursing note and vitals reviewed. Constitutional: She is oriented to person, place, and time. She appears well-developed and well-nourished.  HENT:  Right Ear: External ear normal.  Left Ear: External ear normal.  Nose: Nose normal.  Mouth/Throat: Oropharynx is clear and moist. No oropharyngeal exudate.  Eyes: Conjunctivae are normal. Pupils are equal, round, and reactive to light.  Neck: Normal range of motion. Neck supple. No thyromegaly present.  Cardiovascular: Normal rate, regular rhythm and normal heart sounds.   Pulmonary/Chest: Effort normal and breath sounds normal.  Abdominal: Soft. Bowel sounds are normal. She exhibits no distension. There is no tenderness. There is no rebound.  Lymphadenopathy:    She has no cervical adenopathy.  Neurological: She is alert and oriented to person, place, and time. She has normal reflexes.  Skin: Skin is warm and dry.  Psychiatric: She has a normal mood and affect. Her behavior is normal.           Assessment & Plan:  Aundra MilletMegan was seen today for follow-up.  Diagnoses and associated orders for this visit:  Skin lesions - Ambulatory referral to Dermatology  Seasonal allergies - cetirizine (ZYRTEC) 10 MG tablet; Take 1 tablet (10 mg total) by mouth daily. - fluticasone (FLONASE) 50 MCG/ACT nasal spray; 1-2 sprays per nostril daily.  Overweight - discussed diet abd exercise, advised 20 minutes exertion 3x weekly to start and at least 50% each meal produce. Avoid fatty foods and fast foods.    Vision/tia sx - sees neuro lready. Suggest f/u with neuro. If new sx develop go to ED.  F/u 6 mos, earlier if needed.

## 2013-05-13 ENCOUNTER — Telehealth: Payer: Self-pay | Admitting: Orthopedic Surgery

## 2013-05-13 NOTE — Telephone Encounter (Signed)
Patient called to follow up on:  (1) nerve conduction study results - states did have the appointment and study done per referral to Dr Gerilyn Pilgrimoonquah, 02/19/13.  States has not heard; however, primary care physician, due to secondary insurer Medicaid, may have received the report.                                                    (2) states never was able to come back to pick up the replacement carpal tunnel brace from [employer], which she was notified had been ready for pickup. Please advise.   Her ph# is [phone].

## 2013-05-15 NOTE — Telephone Encounter (Signed)
Reviewed with nurse, no report of nerve conduction study results received.  Faxed note to Dr. Ronal Fearoonquah's office requesting report.  Called back to patient to notify.   Also relayed that the replacement brace had been put back into stock as patient did not come to pick it up.  She states that she did stop by our office one time, and we had been closed for lunch. She not responded to message reminder to pick up.   Scheduled appointment with note attached that it is pending receipt of reports.  Patient voiced understanding.

## 2013-05-29 ENCOUNTER — Ambulatory Visit (INDEPENDENT_AMBULATORY_CARE_PROVIDER_SITE_OTHER): Payer: 59 | Admitting: Orthopedic Surgery

## 2013-05-29 VITALS — BP 122/84 | Ht 66.0 in | Wt 208.0 lb

## 2013-05-29 DIAGNOSIS — G5600 Carpal tunnel syndrome, unspecified upper limb: Secondary | ICD-10-CM | POA: Insufficient documentation

## 2013-05-29 DIAGNOSIS — M5430 Sciatica, unspecified side: Secondary | ICD-10-CM

## 2013-05-29 MED ORDER — PREDNISONE (PAK) 10 MG PO TABS: ORAL_TABLET | Freq: Every day | ORAL | Status: DC

## 2013-05-29 NOTE — Patient Instructions (Signed)
Meds ordered this encounter  Medications  . predniSONE (STERAPRED UNI-PAK) 10 MG tablet    Sig: Take by mouth daily. 10 mg dose pack as directed for 12 days    Dispense:  48 tablet    Refill:  0    Continue splinting   Call us when she wants to have carpal tunnel release

## 2013-05-29 NOTE — Progress Notes (Signed)
Patient ID: Kelli Calhoun, female   DOB: 24-Dec-1980, 33 y.o.   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 Recommend carpal tunnel release. She will call us when she wants to have the left one done with a possible work for 3-4 days and right when she is 3 weeks to be out of work  I called in a Dosepak for her sciatic symptoms  She will reschedule to have her back x-rayed and have her shoulders checked  Time 15 min

## 2013-06-24 ENCOUNTER — Ambulatory Visit: Payer: 59 | Admitting: Orthopedic Surgery

## 2013-07-29 ENCOUNTER — Ambulatory Visit (INDEPENDENT_AMBULATORY_CARE_PROVIDER_SITE_OTHER): Payer: 59 | Admitting: Orthopedic Surgery

## 2013-07-29 ENCOUNTER — Ambulatory Visit (INDEPENDENT_AMBULATORY_CARE_PROVIDER_SITE_OTHER): Payer: 59

## 2013-07-29 VITALS — BP 119/79 | Ht 66.0 in | Wt 208.0 lb

## 2013-07-29 DIAGNOSIS — M5430 Sciatica, unspecified side: Secondary | ICD-10-CM

## 2013-07-29 DIAGNOSIS — Q762 Congenital spondylolisthesis: Secondary | ICD-10-CM

## 2013-07-29 DIAGNOSIS — M5440 Lumbago with sciatica, unspecified side: Secondary | ICD-10-CM

## 2013-07-29 DIAGNOSIS — M4317 Spondylolisthesis, lumbosacral region: Secondary | ICD-10-CM | POA: Insufficient documentation

## 2013-07-29 DIAGNOSIS — M545 Low back pain, unspecified: Secondary | ICD-10-CM | POA: Insufficient documentation

## 2013-07-29 DIAGNOSIS — M542 Cervicalgia: Secondary | ICD-10-CM

## 2013-07-29 DIAGNOSIS — G5600 Carpal tunnel syndrome, unspecified upper limb: Secondary | ICD-10-CM

## 2013-07-29 MED ORDER — CYCLOBENZAPRINE HCL 10 MG PO TABS: 10.0000 mg | ORAL_TABLET | Freq: Two times a day (BID) | ORAL | Status: DC | PRN

## 2013-07-29 MED ORDER — GABAPENTIN 100 MG PO CAPS: 100.0000 mg | ORAL_CAPSULE | Freq: Three times a day (TID) | ORAL | Status: DC

## 2013-07-29 NOTE — Patient Instructions (Signed)
Call to arrange physical therapy Call when therapy is completed

## 2013-07-29 NOTE — Progress Notes (Signed)
Patient ID: Rennis GoldenMegan J Jones, female   DOB: 1980/07/29, 33 y.o.   MRN: [id number]   Chief Complaint  Patient presents with  . Back Pain    back xrays and bilateral shoulder check    HISTORY: 33-year-old chronic history of back pain recent onset of leg pain on the left radiating from the knee to the foot. Work related Pain swelling stiffness shoulders and lower back Sharp burning stabbing aching radiating pain After activity 10 out of 10 Improved with rest The patient has known spondylolisthesis. Medical history depression.  History of appendectomy cryosurgery on the cervix and close reduction with wrist at age 33. Currently I gabapentin 3 times a day Zyrtec and fluticasone allergies none  Family history diabetes heart disease hypertension stroke DVT cancer depression  Parents are deceased  Review of systems swelling arms legs, sinus problems, shortness of breath, joint pain limb pain  Visual disturbance  Headache memory problems numbness tingling  Frequent bladder infection dysuria. Anxiety depression.  Other review of systems normal.  BP 119/79  Ht 5\' 6"  (1.676 m)  Wt 208 lb (94.348 kg)  BMI 33.59 kg/m2 Overall appearance is normal BMI is 33 oriented x3 mood and affect normal ambulation normal  Shoulder range of motion normal strength normal stability normal  Cervical spine mild pain with range of motion no radicular symptoms with range of motion increased tension especially left side. Skin normal. Mild tenderness in the trapezius muscles.  Distal reflexes are 0-1+ bilaterally upper and lower no sensory changes lymph nodes are normal no cervical supraclavicular or axillary regions  X-rays show spondylolisthesis and T12-L1 junction angulation sagittal and coronal plane  Encounter Diagnoses  Name Primary?  . Low back pain with sciatica, sciatica laterality unspecified, unspecified back pain laterality Yes  . Spondylolisthesis at L5-S1 level   . Neck pain   . Carpal  tunnel syndrome, unspecified laterality     All nonsurgical lesions at this. Recommend physical therapy and medication. I don't think surgery is indicated at this point she's not had adequate nonoperative treatment. After therapy I wanted to call me out talk to her about further interventions if she does not improve which would include MRI and referral to neurosurgery if necessary otherwise treat with over-the-counter medications and muscle relaxers and good core mechanics as needed.

## 2013-08-20 ENCOUNTER — Ambulatory Visit (HOSPITAL_COMMUNITY)
Admission: RE | Admit: 2013-08-20 | Discharge: 2013-08-20 | Disposition: A | Discharge status: Home / Self Care | Payer: 59 | Source: Ambulatory Visit | Attending: Orthopedic Surgery | Admitting: Orthopedic Surgery

## 2013-08-20 DIAGNOSIS — G44221 Chronic tension-type headache, intractable: Secondary | ICD-10-CM

## 2013-08-20 DIAGNOSIS — G44229 Chronic tension-type headache, not intractable: Secondary | ICD-10-CM | POA: Insufficient documentation

## 2013-08-20 DIAGNOSIS — IMO0001 Reserved for inherently not codable concepts without codable children: Secondary | ICD-10-CM | POA: Diagnosis not present

## 2013-08-20 DIAGNOSIS — M545 Low back pain, unspecified: Secondary | ICD-10-CM | POA: Diagnosis not present

## 2013-08-20 DIAGNOSIS — M542 Cervicalgia: Secondary | ICD-10-CM | POA: Insufficient documentation

## 2013-08-20 DIAGNOSIS — Q762 Congenital spondylolisthesis: Secondary | ICD-10-CM | POA: Insufficient documentation

## 2013-08-20 NOTE — Evaluation (Signed)
Physical Therapy Evaluation  Patient Details  Name: Kelli Calhoun MRN: [id number] Date of Birth: March 24, 1980  Today's Date: 08/20/2013 Time: 1430-1520 PT Time Calculation (min): 50 min     Charges: 1 Evaluation         Visit#: 1 of 16  Re-eval: 09/19/13 Assessment Diagnosis: Neck pain with head aches and low back pain secondary to lumbar spondyloisthesis Next MD Visit: 10/08/13 Dr. Romeo Apple Prior Therapy: No  Authorization: UHC/Medicaid     Past Medical History: No past medical history on file. Past Surgical History:  Past Surgical History  Procedure Laterality Date  . Appendectomy    . Fracture surgery    . Mole removal     Subjective Symptoms/Limitations Symptoms: pain in back neck, posterior Lt leg Pertinent History: Patient arrives with primary complaint of Low back pain and secondary complaint of neck pain with head aches. Patient has had low back pain >15years. Patient neck pain is predominantly Lt sided, Low back pain is also predominantly Lt sided and radiates from anterior knee to foot to  back to Pt hip. 3 children, most recent child birth 2011,  carpal tunnel syndrome.  Limitations: Sitting;Standing How long can you sit comfortably?: <4minutes neck pain and back/hip pain worsen How long can you stand comfortably?: difficulty perfrorming prolonged standing requiring weight shifting Lt and Rt.  Patient Stated Goals: to have less pain, to be able to tie shoes withotu pain, to be able to look into blind spot while driving.  Pain Assessment Currently in Pain?: Yes Pain Score: 4  Pain Location: Back Pain Orientation: Left Pain Type: Chronic pain Pain Radiating Towards: down Lt LE Pain Onset: More than a month ago Pain Frequency: Constant Pain Relieving Factors: Neck: heat, Total Pillow, heat Effect of Pain on Daily Activities: difficulty walking, twisting, bending over to touch toes.   Cognition/Observation Observation/Other Assessments Observations: gait: excessive  toe out, mionor excessive arch collapse, excessive calcaneal eversion (minor) excessive knee valgus, limited stride length, early heal rise,   Sensation/Coordination/Flexibility/Functional Tests Flexibility Thomas: Positive 90/90: Positive Functional Tests Functional Tests: +piriformis test Functional Tests: + tension head aches  Assessment RLE AROM (degrees) Right Ankle Dorsiflexion: 0 RLE Strength Right Hip External Rotation : 41 Right Hip Internal Rotation : 26 LLE AROM (degrees) Left Hip External Rotation : 40 Left Hip Internal Rotation : 26 Left Ankle Dorsiflexion: 5 Cervical AROM Cervical Flexion: 56 Cervical Extension: 47 Cervical - Right Side Bend: 50 (pain) Cervical - Left Side Bend: 46 (pain) Cervical - Right Rotation: 63 Cervical - Left Rotation: 53 Lumbar AROM Lumbar Flexion: 90 Lumbar Extension: 50 Lumbar - Right Side Bend: 2 inches shorter and painful.  Lumbar - Right Rotation: 70 Lumbar - Left Rotation: 70 Palpation Palpation: limited upper trap, levator, and suboccipitals, limited up nad down glides bilaterally.   Physical Therapy Assessment and Plan PT Assessment and Plan Clinical Impression Statement: Patient displays 2 primary diagnosis: 1.) Cervicogenic headaches secondary to limited suboccipital muscle and upper trpezius muscle mobility attributed to poor posturing with prolonged sitting, 2.) Low back pain with radiating symptoms into bilateral LE secondary to limited hip mobility/strength, decreased abdominal muscle strength and abnormal gait. Patient will benefit from skilled PT to improve these deficits to return to work without pain.     Pt will benefit from skilled therapeutic intervention in order to improve on the following deficits: Abnormal gait;Decreased activity tolerance;Difficulty walking;Decreased strength;Decreased range of motion;Increased muscle spasms;Impaired flexibility;Pain;Improper spinal/pelvic alignment;Improper body mechanics Rehab  Potential: Fair Clinical Impairments Affecting Rehab  Potential: aPatient has a long history of low back pain PT Frequency: Min 2X/week PT Duration: 12 weeks PT Plan: Initial treatment to focus on improrivng neck joint mobility via manual joint mobilizations, stretches and with low intensity strengthieng on deep neck flexors. As neck mobility improves focus to shift to postureal strengthenign of thoracic spine. Lumbar spine to be further assessned next session to assess lumbar spine joint mobility and trnk stability. Initial lumbar spine treatment to focus on improving LE mobility (specifically piriformis, calf, hip flexor, hamstring) with progressing to  increasing hip stability and trunk stability.     Goals Home Exercise Program Pt/caregiver will Perform Home Exercise Program: For increased ROM PT Goal: Perform Home Exercise Program - Progress: Goal set today PT Short Term Goals Time to Complete Short Term Goals: 4 weeks PT Short Term Goal 1: patient will display increaseed cervical sidebendign to > 50 degrees bilaterally to be able hold phone acainst shoulder PT Short Term Goal 2: Patient will be able to Rotate cevical spine bilaterlly >70 degrees to be able to look intoblind spot when driving PT Short Term Goal 3: Patient will be demonstrate improved sub occipital muscle and upper trapezius muscle mobility by having no tenderness to palpation and decreased frequency of headaches to <1x per week PT Short Term Goal 4: Patient will dispaly increased hip internal rotation to >35 degrees bilaterally indicatign improved ability to decelerate impact during gait.  PT Short Term Goal 5: Patient will demsontrate increased ankle dorsiflexion mobility >14 degrees bilaterally to improve early heel rise during gait.  Additional PT Short Term Goals?: Yes PT Short Term Goal 6: patient will demosntrate a negative thomas test indicatign improved hip extesnion for an increased stride length.  PT Long Term  Goals Time to Complete Long Term Goals: 8 weeks PT Long Term Goal 1: Patient will dmonstrate deep neck flexion endurance test of >60 secodns indicatign improved cervical spine stability  Problem List Patient Active Problem List   Diagnosis Date Noted  . Cervicalgia 08/20/2013  . Chronic tension headaches 08/20/2013  . Lumbago 07/29/2013  . Spondylolisthesis at L5-S1 level 07/29/2013  . Neck pain 07/29/2013  . Sciatica 05/29/2013  . CTS (carpal tunnel syndrome) 05/29/2013  . Overweight 12/18/2012  . Restless leg syndrome 12/18/2012  . Carpal tunnel syndrome 12/18/2012  . Heavy periods 12/18/2012    PT - End of Session Activity Tolerance: Patient tolerated treatment well General Behavior During Therapy: WFL for tasks assessed/performed PT Plan of Care PT Home Exercise Plan: none given due to limited time  GP    Najir Roop R 08/20/2013, 8:16 PM  Physician Documentation Your signature is required to indicate approval of the treatment plan as stated above.  Please sign and either send electronically or make a copy of this report for your files and return this physician signed original.   Please mark one 1.__approve of plan  2. ___approve of plan with the following conditions.   ______________________________                                                          _____________________ Physician Signature  Date  

## 2013-09-02 ENCOUNTER — Ambulatory Visit (HOSPITAL_COMMUNITY)
Admission: RE | Admit: 2013-09-02 | Discharge: 2013-09-02 | Disposition: A | Discharge status: Home / Self Care | Payer: 59 | Source: Ambulatory Visit | Attending: Orthopedic Surgery | Admitting: Orthopedic Surgery

## 2013-09-02 DIAGNOSIS — IMO0001 Reserved for inherently not codable concepts without codable children: Secondary | ICD-10-CM | POA: Diagnosis not present

## 2013-09-02 NOTE — Progress Notes (Signed)
Physical Therapy Treatment Patient Details  Name: Kelli Calhoun MRN: [id number] Date of Birth: 05-08-1980  Today's Date: 09/02/2013 Time: 1515-1600 PT Time Calculation (min): 45 min  Visit#: 2 of 16  Re-eval: 09/19/13 Authorization: UHC/Medicaid  Charges:  therex 45  Subjective: Symptoms/Limitations Symptoms: Pt states she currently is pain free and cannont remember the last day she did have pain.  Pt reports she believes stress is the largest contributor to her pain.  PT reports complaince with HEP. Pain Assessment Currently in Pain?: No/denies   Exercise/Treatments Stretches Active Hamstring Stretch: 3 reps;20 seconds;Limitations Active Hamstring Stretch Limitations: 3 way with 12" step bilaterally Hip Flexor Stretch: 3 reps;20 seconds;Limitations Hip Flexor Stretch Limitations: 3 way bilaterally Piriformis Stretch: 4 reps;20 seconds;Limitations Piriformis Stretch Limitations: seated bilaterally Standing Other Standing Lumbar Exercises: gastroc stretch 3X20" Other Standing Lumbar Exercises: groin stretch with 8" box 3X20" each  Seated Other Seated Lumbar Exercises: cervical excursions 5 reps each     Physical Therapy Assessment and Plan PT Assessment and Plan Clinical Impression Statement: Patient instructed with LE stretches per PT POC as well as cervical excursions.  PT able to [facility] correctly with therapist facilitation.  PT reports compliance with given HEP (seated piriformis stretch, neck stretches).  Pt with noted tightness with stretches.  Also educated on proper breathing to help with stress reduction. PT Plan: Progress with postural  strengthening and education. Continue with LE mobility progressing to  increasing hip stability and trunk stability.  Add thoracic and hip excursions next visit.       Problem List Patient Active Problem List   Diagnosis Date Noted  . Cervicalgia 08/20/2013  . Chronic tension headaches 08/20/2013  . Lumbago 07/29/2013   . Spondylolisthesis at L5-S1 level 07/29/2013  . Neck pain 07/29/2013  . Sciatica 05/29/2013  . CTS (carpal tunnel syndrome) 05/29/2013  . Overweight 12/18/2012  . Restless leg syndrome 12/18/2012  . Carpal tunnel syndrome 12/18/2012  . Heavy periods 12/18/2012    PT - End of Session Activity Tolerance: Patient tolerated treatment well General Behavior During Therapy: WFL for tasks assessed/performed PT Plan of Care PT Home Exercise Plan: LE stretches, cervical stretches, cervical excursion   Lurena Nida, PTA/CLT 09/02/2013, 4:12 PM

## 2013-09-04 ENCOUNTER — Telehealth (HOSPITAL_COMMUNITY): Payer: Self-pay

## 2013-09-04 ENCOUNTER — Inpatient Hospital Stay (HOSPITAL_COMMUNITY): Admission: RE | Admit: 2013-09-04 | Payer: 59 | Source: Ambulatory Visit

## 2013-09-09 ENCOUNTER — Ambulatory Visit (HOSPITAL_COMMUNITY)
Admission: RE | Admit: 2013-09-09 | Discharge: 2013-09-09 | Disposition: A | Discharge status: Home / Self Care | Payer: 59 | Source: Ambulatory Visit | Attending: Orthopedic Surgery | Admitting: Orthopedic Surgery

## 2013-09-09 DIAGNOSIS — IMO0001 Reserved for inherently not codable concepts without codable children: Secondary | ICD-10-CM | POA: Diagnosis not present

## 2013-09-09 DIAGNOSIS — M542 Cervicalgia: Secondary | ICD-10-CM | POA: Diagnosis present

## 2013-09-09 DIAGNOSIS — M545 Low back pain, unspecified: Secondary | ICD-10-CM | POA: Insufficient documentation

## 2013-09-09 DIAGNOSIS — Q762 Congenital spondylolisthesis: Secondary | ICD-10-CM | POA: Insufficient documentation

## 2013-09-09 DIAGNOSIS — G44229 Chronic tension-type headache, not intractable: Secondary | ICD-10-CM | POA: Diagnosis not present

## 2013-09-09 NOTE — Progress Notes (Signed)
Physical Therapy Treatment Patient Details  Name: Kelli Calhoun MRN: [id number] Date of Birth: 03-27-1980  Today's Date: 09/09/2013 Time: 1324-4010 PT Time Calculation (min): 38 min Charge: TE [id number]   Visit#: 3 of 16  Re-eval: 09/19/13 Assessment Diagnosis: Neck pain with head aches and low back pain secondary to lumbar spondyloisthesis Next MD Visit: Dr. Romeo Apple when PT done Prior Therapy: No  Authorization: UHC/Medicaid  Authorization Time Period:    Authorization Visit#:   of     Subjective: Symptoms/Limitations Symptoms: Pt reported new pain on left side of LBP, neck pain comes and goes.   Pain Assessment Currently in Pain?: Yes Pain Score: 6  Pain Location: Back Pain Orientation: Left  Objective   Exercise/Treatments Stretches Upper Trapezius Stretch: 3 reps;30 seconds Neck Stretch: 3 reps;30 seconds Seated Exercises Neck Retraction: 10 reps W Back: 10 reps Other Seated Exercise: 3D cervical excursion  Stretches Active Hamstring Stretch: 3 reps;20 seconds;Limitations Active Hamstring Stretch Limitations: 3 way with 12" step bilaterally Hip Flexor Stretch: 3 reps;20 seconds;Limitations Hip Flexor Stretch Limitations: 3 way bilaterally Piriformis Stretch: 4 reps;20 seconds;Limitations Piriformis Stretch Limitations: seated bilaterally Standing Other Standing Lumbar Exercises: gastroc stretch 3X20" Seated Other Seated Lumbar Exercises: Thoracic 3D excursion with UE movements; Upper traps stretch 3x 30" Other Seated Lumbar Exercises: cervical excursions 10 reps each    Physical Therapy Assessment and Plan PT Assessment and Plan Clinical Impression Statement: Session focus on education on proper posture to reduce pain in cervical and lumbar region.  Added cervical and scapular retraction for postural strengtheing and began thoracic and hip excursion to improve throacic and lumbar mobilty.  Added upper traps and levator stretches and pt given HEP to  add at home.  No reports of increased pain through session.   PT Plan: Progress with postural  strengthening and education. Continue with LE mobility progressing to  increasing hip stability and trunk stability.    Goals PT Short Term Goals PT Short Term Goal 1: patient will display increaseed cervical sidebendign to > 50 degrees bilaterally to be able hold phone acainst shoulder PT Short Term Goal 1 - Progress: Progressing toward goal PT Short Term Goal 2: Patient will be able to Rotate cevical spine bilaterlly >70 degrees to be able to look intoblind spot when driving PT Short Term Goal 2 - Progress: Progressing toward goal PT Short Term Goal 3: Patient will be demonstrate improved sub occipital muscle and upper trapezius muscle mobility by having no tenderness to palpation and decreased frequency of headaches to <1x per week PT Short Term Goal 3 - Progress: Progressing toward goal PT Short Term Goal 4: Patient will dispaly increased hip internal rotation to >35 degrees bilaterally indicatign improved ability to decelerate impact during gait.  PT Short Term Goal 5: Patient will demsontrate increased ankle dorsiflexion mobility >14 degrees bilaterally to improve early heel rise during gait.  PT Short Term Goal 6: patient will demosntrate a negative thomas test indicatign improved hip extesnion for an increased stride length.  PT Long Term Goals PT Long Term Goal 1: Patient will dmonstrate deep neck flexion endurance test of >60 secodns indicatign improved cervical spine stability  Problem List Patient Active Problem List   Diagnosis Date Noted  . Cervicalgia 08/20/2013  . Chronic tension headaches 08/20/2013  . Lumbago 07/29/2013  . Spondylolisthesis at L5-S1 level 07/29/2013  . Neck pain 07/29/2013  . Sciatica 05/29/2013  . CTS (carpal tunnel syndrome) 05/29/2013  . Overweight 12/18/2012  . Restless leg syndrome 12/18/2012  .  Carpal tunnel syndrome 12/18/2012  . Heavy periods  12/18/2012    PT - End of Session Activity Tolerance: Patient tolerated treatment well General Behavior During Therapy: [facility] for tasks assessed/performed  GP    Juel Burrow 09/09/2013, 4:00 PM

## 2013-09-11 ENCOUNTER — Ambulatory Visit (HOSPITAL_COMMUNITY)
Admission: RE | Admit: 2013-09-11 | Discharge: 2013-09-11 | Disposition: A | Discharge status: Home / Self Care | Payer: 59 | Source: Ambulatory Visit | Attending: Orthopedic Surgery | Admitting: Orthopedic Surgery

## 2013-09-11 DIAGNOSIS — IMO0001 Reserved for inherently not codable concepts without codable children: Secondary | ICD-10-CM | POA: Diagnosis not present

## 2013-09-11 NOTE — Progress Notes (Signed)
Physical Therapy Treatment Patient Details  Name: Kelli Calhoun MRN: [id number] Date of Birth: 1980-03-31  Today's Date: 09/11/2013 Time: 8657-8469 PT Time Calculation (min): 45 min Charge: TE 6295-2841, Manual [id number]  Visit#: 4 of 16  Re-eval: 09/19/13 Assessment Diagnosis: Neck pain with head aches and low back pain secondary to lumbar spondyloisthesis Next MD Visit: Dr. Romeo Apple when PT done Prior Therapy: No  Authorization: UHC/Medicaid  Authorization Time Period:    Authorization Visit#:   of     Subjective: Symptoms/Limitations Symptoms: Pt reported pain scale 6-7/10 LBP and neck is stiff Lt side  Pain Assessment Currently in Pain?: Yes Pain Score: 6  Pain Location: Back Pain Orientation: Left  Objective:  Exercise/Treatments Seated Exercises Neck Retraction: 10 reps W Back: 10 reps Stretches Active Hamstring Stretch: 3 reps;20 seconds;Limitations Active Hamstring Stretch Limitations: 3 way with 12" step bilaterally Hip Flexor Stretch: 3 reps;20 seconds;Limitations Hip Flexor Stretch Limitations: 3 way bilaterally  Seated Other Seated Lumbar Exercises: Thoracic 3D excursion with UE movements; Upper traps stretch 3x 30" Other Seated Lumbar Exercises: cervical excursions 10 reps each  Manual Therapy Manual Therapy: Massage Massage: Prone Lt side upper traps, levator scapular, scapular region to QL  Physical Therapy Assessment and Plan PT Assessment and Plan Clinical Impression Statement: Session focus on improving cervical, thoracic and lumbar mobility.  Continued with stretches and postural strengthening exercises with therapist facilitation to reduce forward head and forward rolled shoulders.  Ended sesson with manual soft tissue massage to reduce spasms noted in Lt upper traps, rhomboids and QL.  Pt reported pain reduced to 3/10 at end of sessoin.  Pt encouraged to drink extra water to reduce risk of headaches following manual.   PT Plan: Progress with  postural  strengthening and education. Continue with LE mobility progressing to  increasing hip stability and trunk stability.    Goals PT Short Term Goals PT Short Term Goal 1: patient will display increaseed cervical sidebendign to > 50 degrees bilaterally to be able hold phone acainst shoulder PT Short Term Goal 2: Patient will be able to Rotate cevical spine bilaterlly >70 degrees to be able to look intoblind spot when driving PT Short Term Goal 3: Patient will be demonstrate improved sub occipital muscle and upper trapezius muscle mobility by having no tenderness to palpation and decreased frequency of headaches to <1x per week PT Short Term Goal 4: Patient will dispaly increased hip internal rotation to >35 degrees bilaterally indicatign improved ability to decelerate impact during gait.  PT Short Term Goal 5: Patient will demsontrate increased ankle dorsiflexion mobility >14 degrees bilaterally to improve early heel rise during gait.  PT Short Term Goal 6: patient will demosntrate a negative thomas test indicatign improved hip extesnion for an increased stride length.  PT Long Term Goals PT Long Term Goal 1: Patient will dmonstrate deep neck flexion endurance test of >60 secodns indicatign improved cervical spine stability  Problem List Patient Active Problem List   Diagnosis Date Noted  . Cervicalgia 08/20/2013  . Chronic tension headaches 08/20/2013  . Lumbago 07/29/2013  . Spondylolisthesis at L5-S1 level 07/29/2013  . Neck pain 07/29/2013  . Sciatica 05/29/2013  . CTS (carpal tunnel syndrome) 05/29/2013  . Overweight 12/18/2012  . Restless leg syndrome 12/18/2012  . Carpal tunnel syndrome 12/18/2012  . Heavy periods 12/18/2012    PT - End of Session Activity Tolerance: Patient tolerated treatment well General Behavior During Therapy: [facility] for tasks assessed/performed  GP    Juel Burrow 09/11/2013,  3:26 PM

## 2013-09-16 ENCOUNTER — Ambulatory Visit (HOSPITAL_COMMUNITY)
Admission: RE | Admit: 2013-09-16 | Discharge: 2013-09-16 | Disposition: A | Discharge status: Home / Self Care | Payer: 59 | Source: Ambulatory Visit | Attending: Orthopedic Surgery | Admitting: Orthopedic Surgery

## 2013-09-16 DIAGNOSIS — IMO0001 Reserved for inherently not codable concepts without codable children: Secondary | ICD-10-CM | POA: Diagnosis not present

## 2013-09-16 NOTE — Progress Notes (Signed)
Physical Therapy Treatment Patient Details  Name: Kelli Calhoun MRN: [id number] Date of Birth: 01-31-1980  Today's Date: 09/16/2013 Time: 1478-2956 PT Time Calculation (min): 45 min Visit#: 5 of 16  Re-eval: 09/19/13 Authorization: UHC/Medicaid  Charges:  therex 1430-1500 (30'), manual 1500-1515 (15')  Subjective:  Pt states she is currently painfree.  States she did hurt this morning, 7/10 but went away over the course of the day.     Exercise/Treatments Stretches Active Hamstring Stretch: 3 reps;20 seconds;Limitations Active Hamstring Stretch Limitations: 3 way with 12" step bilaterally Hip Flexor Stretch: 3 reps;20 seconds;Limitations Hip Flexor Stretch Limitations: 3 way bilaterally Piriformis Stretch: 4 reps;20 seconds;Limitations Piriformis Stretch Limitations: seated bilaterally Aerobic UBE (Upper Arm Bike): 5 minutes backward level #3 Standing Other Standing Lumbar Exercises: 3D hip excursion Seated Other Seated Lumbar Exercises: Thoracic 3D excursion with UE movements; Upper traps stretch 3x 30" Other Seated Lumbar Exercises: cervical excursions 10 reps each  Manual Therapy Manual Therapy: Massage Massage: prone lower lumbar concentration using roller and manual STM techniques  Physical Therapy Assessment and Plan PT Assessment and Plan Clinical Impression Statement: pt reported good results from manual techniques last visit.  Pt requires therapist faciliation to complete exercises correct form. Continued with manual techniques.  Several spasms in Lt upper thoracic region resolved with massage.  PT Plan: Progress with postural  strengthening and education. Continue with LE mobility progressing to  increasing hip stability and trunk stability.     Problem List Patient Active Problem List   Diagnosis Date Noted  . Cervicalgia 08/20/2013  . Chronic tension headaches 08/20/2013  . Lumbago 07/29/2013  . Spondylolisthesis at L5-S1 level 07/29/2013  . Neck pain  07/29/2013  . Sciatica 05/29/2013  . CTS (carpal tunnel syndrome) 05/29/2013  . Overweight 12/18/2012  . Restless leg syndrome 12/18/2012  . Carpal tunnel syndrome 12/18/2012  . Heavy periods 12/18/2012       GP    Lurena Nida, PTA/CLT 09/16/2013, 5:20 PM

## 2013-09-18 ENCOUNTER — Ambulatory Visit (HOSPITAL_COMMUNITY)
Admission: RE | Admit: 2013-09-18 | Payer: 59 | Source: Ambulatory Visit | Attending: Orthopedic Surgery | Admitting: Orthopedic Surgery

## 2013-09-23 ENCOUNTER — Ambulatory Visit (HOSPITAL_COMMUNITY): Payer: 59 | Admitting: Physical Therapy

## 2013-09-25 ENCOUNTER — Ambulatory Visit (HOSPITAL_COMMUNITY)
Admission: RE | Admit: 2013-09-25 | Discharge: 2013-09-25 | Disposition: A | Discharge status: Home / Self Care | Payer: 59 | Source: Ambulatory Visit | Attending: Orthopedic Surgery | Admitting: Orthopedic Surgery

## 2013-09-25 DIAGNOSIS — IMO0001 Reserved for inherently not codable concepts without codable children: Secondary | ICD-10-CM | POA: Diagnosis not present

## 2013-09-25 NOTE — Evaluation (Signed)
Physical Therapy Reassessment  Patient Details  Name: Kelli Calhoun MRN: [id number] Date of Birth: 17-Mar-1980  Today's Date: 09/25/2013 Time: 0867-6195 PT Time Calculation (min): 45 min    Charges: TE 1430-1456, Manual 1456-1512, 1 MMT, 1 ROMM          Visit#: 6 of 16  Re-eval: 09/19/13 Assessment Diagnosis: Neck pain with head aches and low back pain secondary to lumbar spondyloisthesis Next MD Visit: Dr. Aline Brochure when PT done Prior Therapy: No  Authorization: UHC/Medicaid     Past Medical History: No past medical history on file. Past Surgical History:  Past Surgical History  Procedure Laterality Date  . Appendectomy    . Fracture surgery    . Mole removal      Subjective Symptoms/Limitations Symptoms: Patient notes her low back has been hurting recently. Patient notes neck has been feeling better as long as she deosnt stress to much.  Pain Assessment Currently in Pain?: Yes Pain Score: 6  Pain Location: Back Pain Orientation: Left Pain Type: Chronic pain Pain Frequency: Constant   Sensation/Coordination/Flexibility/Functional Tests Flexibility Thomas: Positive 90/90: Negative Functional Tests Functional Tests: +piriformis test on Rt only now Functional Tests: + tension head aches  Assessment RLE AROM (degrees) Right Ankle Dorsiflexion: 5 RLE Strength Right Hip External Rotation : 41 Right Hip Internal Rotation : 30 LLE AROM (degrees) Left Hip External Rotation : 45 Left Hip Internal Rotation : 36 Left Ankle Dorsiflexion: 6 Cervical AROM Cervical Flexion: WNL Cervical Extension: WNL Cervical - Right Side Bend: no pain Cervical - Left Side Bend: painful Cervical - Right Rotation: 67 Cervical - Left Rotation: 45 Lumbar AROM Lumbar Flexion: WNL Lumbar Extension: 30 Lumbar - Right Rotation: 70 Lumbar - Left Rotation: 70 Lumbar Strength Lumbar Flexion: 3+/5 Lumbar Extension: 3+/5  Exercise/Treatments Stretches Active Hamstring Stretch: 3 reps;20  seconds;Limitations Active Hamstring Stretch Limitations: 3 way with 12" step bilaterally Hip Flexor Stretch: 3 reps;20 seconds;Limitations Hip Flexor Stretch Limitations: 3 way bilaterally Piriformis Stretch: 4 reps;20 seconds;Limitations Piriformis Stretch Limitations: seated bilaterally Standing Other Standing Lumbar Exercises: gastroc stretch 3X20" Other Standing Lumbar Exercises: 3D hip excursion 10x Seated Other Seated Lumbar Exercises: Thoracic 3D excursion with UE movements; Upper traps stretch 3x 30"  Manual: manual lumbar spine PA: painful discontinued, prone thomas stretch  Physical Therapy Assessment and Plan PT Assessment and Plan Clinical Impression Statement: Patient notes continued back pain particularly with extension attributed to excessive lumbar extension which reproduces symptoms. Reassessment performed this session with patient continuing to display  pain with lumbar extension  and  relief of symptoms with flexion secondary to limited iliopsoas mobility, and limited rectus femoris mobility along with weak glute and weak abdominal muscles. PT Plan: Progress with postural  strengthening and education. Continue with hip flexor and rectus femoris stretching and progress glut, hamstrign and abdominal strengtheing.     Goals PT Short Term Goals PT Short Term Goal 1: patient will display increaseed cervical sidebendign to > 50 degrees bilaterally to be able hold phone acainst shoulder PT Short Term Goal 1 - Progress: Progressing toward goal PT Short Term Goal 2: Patient will be able to Rotate cevical spine bilaterlly >70 degrees to be able to look intoblind spot when driving PT Short Term Goal 2 - Progress: Partly met PT Short Term Goal 3: Patient will be demonstrate improved sub occipital muscle and upper trapezius muscle mobility by having no tenderness to palpation and decreased frequency of headaches to <1x per week PT Short Term Goal 3 - Progress: Met  PT Short Term Goal  4: Patient will dispaly increased hip internal rotation to >35 degrees bilaterally indicatign improved ability to decelerate impact during gait.  PT Short Term Goal 4 - Progress: Progressing toward goal PT Short Term Goal 5: Patient will demsontrate increased ankle dorsiflexion mobility >14 degrees bilaterally to improve early heel rise during gait.  PT Short Term Goal 5 - Progress: Met PT Short Term Goal 6: patient will demosntrate a negative thomas test indicatign improved hip extesnion for an increased stride length.  PT Long Term Goals PT Long Term Goal 1: Patient will demonstrate deep neck flexion endurance test of >60 secodns indicatign improved cervical spine stability  Problem List Patient Active Problem List   Diagnosis Date Noted  . Cervicalgia 08/20/2013  . Chronic tension headaches 08/20/2013  . Lumbago 07/29/2013  . Spondylolisthesis at L5-S1 level 07/29/2013  . Neck pain 07/29/2013  . Sciatica 05/29/2013  . CTS (carpal tunnel syndrome) 05/29/2013  . Overweight 12/18/2012  . Restless leg syndrome 12/18/2012  . Carpal tunnel syndrome 12/18/2012  . Heavy periods 12/18/2012    PT - End of Session Activity Tolerance: Patient tolerated treatment well General Behavior During Therapy: [facility] for tasks assessed/performed  GP    Eddie Koc R 09/25/2013, 4:18 PM  Physician Documentation Your signature is required to indicate approval of the treatment plan as stated above.  Please sign and either send electronically or make a copy of this report for your files and return this physician signed original.   Please mark one 1.__approve of plan  2. ___approve of plan with the following conditions.   ______________________________                                                          _____________________ Physician Signature                                                                                                             Date

## 2013-09-30 ENCOUNTER — Ambulatory Visit (HOSPITAL_COMMUNITY)
Admission: RE | Admit: 2013-09-30 | Discharge: 2013-09-30 | Disposition: A | Discharge status: Home / Self Care | Payer: 59 | Source: Ambulatory Visit | Attending: Orthopedic Surgery | Admitting: Orthopedic Surgery

## 2013-09-30 DIAGNOSIS — IMO0001 Reserved for inherently not codable concepts without codable children: Secondary | ICD-10-CM | POA: Diagnosis not present

## 2013-09-30 NOTE — Progress Notes (Signed)
Physical Therapy Treatment Patient Details  Name: Donnajean Chesnut MRN: [id number] Date of Birth: [birth year]  Today's Date: 09/30/2013 Time: 4098-1191 PT Time Calculation (min): 40 min Charge: TE 4782-9562  Visit#: 7 of 16  Re-eval: 10/23/13 Assessment Diagnosis: Neck pain with head aches and low back pain secondary to lumbar spondyloisthesis Next MD Visit: Dr. Romeo Apple when PT done Prior Therapy: No  Authorization: UHC/Medicaid  Authorization Time Period:    Authorization Visit#:   of     Subjective: Symptoms/Limitations Symptoms: Pain free today, started to get small migrane this mornong, took Aleve and was resolved.  No headaches for 10 days.   Pain Assessment Currently in Pain?: No/denies  Objective:   Exercise/Treatments Stretches Hip Flexor Stretch: 3 reps;20 seconds;Limitations Hip Flexor Stretch Limitations: 3 way bilaterally; Psoas stretch on edge of mat with 5" hold RROM with 20" manual assistance Quad Stretch: 3 reps;30 seconds;Limitations Quad Stretch Limitations: lunge on 10 in step Piriformis Stretch: 4 reps;20 seconds;Limitations Piriformis Stretch Limitations: seated bilaterally Aerobic UBE (Upper Arm Bike): 5 minutes backward level #3 Standing Functional Squats: 5 reps;Limitations Functional Squats Limitations: squat reach matrix 5 reps with 1# Other Standing Lumbar Exercises: Slant board gastroc st 3x 30" Other Standing Lumbar Exercises: 3D hip excursion 10x, transverse step up 6 in Seated Other Seated Lumbar Exercises: Thoracic 3D excursion with UE movements; Upper traps stretch 3x 30"    Physical Therapy Assessment and Plan PT Assessment and Plan Clinical Impression Statement: Continued wth stretches to improve hip mobility, added lunged position stretch to improve lengthening with rectus femoris and psoas on edge of mat.  Progressed abdominal and gluteal strength with transverse step up and squat reach matrix.  Therapist facilitation for appropriate  loading with new exercises.  No reports of pain through session.   PT Plan: Progress with postural  strengthening and education. Continue with hip flexor and rectus femoris stretching and progress glut, hamstrign and abdominal strengtheing.     Goals PT Short Term Goals PT Short Term Goal 1: patient will display increaseed cervical sidebendign to > 50 degrees bilaterally to be able hold phone acainst shoulder PT Short Term Goal 2: Patient will be able to Rotate cevical spine bilaterlly >70 degrees to be able to look intoblind spot when driving PT Short Term Goal 3: Patient will be demonstrate improved sub occipital muscle and upper trapezius muscle mobility by having no tenderness to palpation and decreased frequency of headaches to <1x per week PT Short Term Goal 4: Patient will dispaly increased hip internal rotation to >35 degrees bilaterally indicatign improved ability to decelerate impact during gait.  PT Short Term Goal 4 - Progress: Progressing toward goal PT Short Term Goal 5: Patient will demsontrate increased ankle dorsiflexion mobility >14 degrees bilaterally to improve early heel rise during gait.  PT Short Term Goal 6: patient will demosntrate a negative thomas test indicatign improved hip extesnion for an increased stride length.  PT Long Term Goals PT Long Term Goal 1: Patient will demonstrate deep neck flexion endurance test of >60 secodns indicatign improved cervical spine stability  Problem List Patient Active Problem List   Diagnosis Date Noted  . Cervicalgia 08/20/2013  . Chronic tension headaches 08/20/2013  . Lumbago 07/29/2013  . Spondylolisthesis at L5-S1 level 07/29/2013  . Neck pain 07/29/2013  . Sciatica 05/29/2013  . CTS (carpal tunnel syndrome) 05/29/2013  . Overweight 12/18/2012  . Restless leg syndrome 12/18/2012  . Carpal tunnel syndrome 12/18/2012  . Heavy periods 12/18/2012    PT -  End of Session Activity Tolerance: Patient tolerated treatment  well General Behavior During Therapy: [facility] for tasks assessed/performed  GP    Juel Burrow 09/30/2013, 3:16 PM

## 2013-10-02 ENCOUNTER — Ambulatory Visit (HOSPITAL_COMMUNITY)
Admission: RE | Admit: 2013-10-02 | Discharge: 2013-10-02 | Disposition: A | Discharge status: Home / Self Care | Payer: 59 | Source: Ambulatory Visit | Attending: Orthopedic Surgery | Admitting: Orthopedic Surgery

## 2013-10-02 DIAGNOSIS — IMO0001 Reserved for inherently not codable concepts without codable children: Secondary | ICD-10-CM | POA: Diagnosis not present

## 2013-10-02 NOTE — Progress Notes (Signed)
Physical Therapy Treatment Patient Details  Name: Kelli Calhoun MRN: [id number] Date of Birth: 09/08/1980  Today's Date: 10/02/2013 Time: 1025-8527 PT Time Calculation (min): 44 min    Charges: TE 7824-2353, Manual 6144-3154  Visit#: 8 of 16  Re-eval: 10/23/13 Assessment Diagnosis: Neck pain with head aches and low back pain secondary to lumbar spondyloisthesis Next MD Visit: Dr. Aline Brochure when PT done Prior Therapy: No  Authorization: UHC/Medicaid   Subjective: Symptoms/Limitations Symptoms: complaint of recent head ache, patient states performing exercises and taking aleve helped decreased pain. no pain now Pain Assessment Currently in Pain?: No/denies  Exercise/Treatments Stretches Hip Flexor Stretch Limitations: 3 way bilaterally; Psoas stretch on edge of mat with 5" hold RROM with 20" manual assistance 10x 3" Quad Stretch: 3 reps;30 seconds;Limitations Piriformis Stretch: 20 seconds;Limitations;3 reps Piriformis Stretch Limitations: seated bilaterally Standing Functional Squats Limitations: squat reach matrix 5 reps with 3# Forward Lunge: 10 reps;Limitations Forward Lunge Limitations: with knee high reach Side Lunge: Limitations Side Lunge Limitations: to 8" with knee high reach, decreased depth.  Other Standing Lumbar Exercises: half kneel lunge matrix on airex with over head 5x each, calf stretch neutral and step over 20seconds each Other Standing Lumbar Exercises: Low back Overhead dumbbell matrix with 5lb dumbbell 5x each Seated Other Seated Lumbar Exercises: Cervical spine matrix 10x with 1lb dowel  Prone  Other Prone Lumbar Exercises: UE/LE ground matrix at raised   Manual therapy: R ankle and calcaneus eversion joint mobilizations grade 3  Physical Therapy Assessment and Plan PT Assessment and Plan Clinical Impression Statement: This session focused on exercise progression to further progress hip mobility and and strength. Durign lateral lunging it was noticed  that patient places excess pressure on lateral plantar surface of foot almost spraining her Rt ankle. follwoing further examination it displayed 0 degrees of calcaneal eversion and <10 degrees of ankle eversion. Patient noted decreased neck stiffness follwoign cervical spine matrix. No pain noted at end of session.  PT Plan: Conitnue 1/2 kneeling lunge matrix for hip mobility. Continue with hip flexor and rectus femoris stretching. Progress squat reach matrix by increasing weight to 5lb, Introduce UE and LE ground matrinx for abdominal strengthening/mobility. slowly progress lunge depth for hamstring and glute strengtheing.      Goals PT Short Term Goals PT Short Term Goal 1: patient will display increaseed cervical sidebendign to > 50 degrees bilaterally to be able hold phone acainst shoulder PT Short Term Goal 1 - Progress: Progressing toward goal PT Short Term Goal 2: Patient will be able to Rotate cevical spine bilaterlly >70 degrees to be able to look intoblind spot when driving PT Short Term Goal 2 - Progress: Partly met PT Short Term Goal 3: Patient will be demonstrate improved sub occipital muscle and upper trapezius muscle mobility by having no tenderness to palpation and decreased frequency of headaches to <1x per week PT Short Term Goal 3 - Progress: Met PT Short Term Goal 4: Patient will dispaly increased hip internal rotation to >35 degrees bilaterally indicatign improved ability to decelerate impact during gait.  PT Short Term Goal 4 - Progress: Progressing toward goal PT Short Term Goal 5: Patient will demsontrate increased ankle dorsiflexion mobility >14 degrees bilaterally to improve early heel rise during gait.  PT Short Term Goal 5 - Progress: Met PT Short Term Goal 6: patient will demosntrate a negative thomas test indicatign improved hip extesnion for an increased stride length.  PT Long Term Goals PT Long Term Goal 1: Patient will demonstrate  deep neck flexion endurance test of  >60 secodns indicatign improved cervical spine stability PT Long Term Goal 2: atient will demosntrated increased Rt ankle eversion to >20 degrees and increased calcaneal eversion to > 5 degree to improve   Problem List Patient Active Problem List   Diagnosis Date Noted  . Cervicalgia 08/20/2013  . Chronic tension headaches 08/20/2013  . Lumbago 07/29/2013  . Spondylolisthesis at L5-S1 level 07/29/2013  . Neck pain 07/29/2013  . Sciatica 05/29/2013  . CTS (carpal tunnel syndrome) 05/29/2013  . Overweight 12/18/2012  . Restless leg syndrome 12/18/2012  . Carpal tunnel syndrome 12/18/2012  . Heavy periods 12/18/2012    PT - End of Session Activity Tolerance: Patient tolerated treatment well General Behavior During Therapy: [facility] for tasks assessed/performed  GP    Rozetta Stumpp R 10/02/2013, 4:07 PM

## 2013-10-07 ENCOUNTER — Ambulatory Visit (HOSPITAL_COMMUNITY): Payer: 59 | Admitting: Physical Therapy

## 2013-10-09 ENCOUNTER — Ambulatory Visit (HOSPITAL_COMMUNITY): Payer: 59 | Admitting: Physical Therapy

## 2013-10-14 ENCOUNTER — Inpatient Hospital Stay (HOSPITAL_COMMUNITY): Admission: RE | Admit: 2013-10-14 | Payer: 59 | Source: Ambulatory Visit

## 2013-10-16 ENCOUNTER — Ambulatory Visit (HOSPITAL_COMMUNITY): Payer: 59 | Attending: Orthopedic Surgery | Admitting: Physical Therapy

## 2013-10-21 ENCOUNTER — Ambulatory Visit (HOSPITAL_COMMUNITY): Payer: 59 | Admitting: Physical Therapy

## 2013-10-23 ENCOUNTER — Ambulatory Visit (HOSPITAL_COMMUNITY)
Admission: RE | Admit: 2013-10-23 | Payer: 59 | Source: Ambulatory Visit | Attending: Orthopedic Surgery | Admitting: Orthopedic Surgery

## 2014-04-15 ENCOUNTER — Emergency Department (HOSPITAL_COMMUNITY)
Admission: EM | Admit: 2014-04-15 | Discharge: 2014-04-15 | Disposition: A | Discharge status: Home / Self Care | Payer: 59 | Attending: Emergency Medicine | Admitting: Emergency Medicine

## 2014-04-15 ENCOUNTER — Encounter (HOSPITAL_COMMUNITY): Payer: Self-pay | Admitting: Emergency Medicine

## 2014-04-15 DIAGNOSIS — Y9289 Other specified places as the place of occurrence of the external cause: Secondary | ICD-10-CM | POA: Diagnosis not present

## 2014-04-15 DIAGNOSIS — Y998 Other external cause status: Secondary | ICD-10-CM | POA: Diagnosis not present

## 2014-04-15 DIAGNOSIS — W57XXXA Bitten or stung by nonvenomous insect and other nonvenomous arthropods, initial encounter: Secondary | ICD-10-CM | POA: Insufficient documentation

## 2014-04-15 DIAGNOSIS — L509 Urticaria, unspecified: Secondary | ICD-10-CM | POA: Insufficient documentation

## 2014-04-15 DIAGNOSIS — R21 Rash and other nonspecific skin eruption: Secondary | ICD-10-CM | POA: Diagnosis present

## 2014-04-15 DIAGNOSIS — S70362A Insect bite (nonvenomous), left thigh, initial encounter: Secondary | ICD-10-CM | POA: Diagnosis not present

## 2014-04-15 DIAGNOSIS — Z87891 Personal history of nicotine dependence: Secondary | ICD-10-CM | POA: Insufficient documentation

## 2014-04-15 DIAGNOSIS — Z79899 Other long term (current) drug therapy: Secondary | ICD-10-CM | POA: Insufficient documentation

## 2014-04-15 DIAGNOSIS — Y9389 Activity, other specified: Secondary | ICD-10-CM | POA: Diagnosis not present

## 2014-04-15 DIAGNOSIS — Z7951 Long term (current) use of inhaled steroids: Secondary | ICD-10-CM | POA: Insufficient documentation

## 2014-04-15 MED ORDER — PREDNISONE 50 MG PO TABS
60.0000 mg | ORAL_TABLET | Freq: Once | ORAL | Status: AC
  Administered 2014-04-15: 10:00:00 60 mg via ORAL
  Filled 2014-04-15 (×2): qty 1

## 2014-04-15 MED ORDER — PREDNISONE 10 MG PO TABS: ORAL_TABLET | ORAL | Status: DC

## 2014-04-15 MED ORDER — DIPHENHYDRAMINE HCL 25 MG PO TABS: 25.0000 mg | ORAL_TABLET | Freq: Four times a day (QID) | ORAL | Status: DC

## 2014-04-15 NOTE — ED Notes (Signed)
Pt reports insect bite to L inner thigh, has since developed intermittent rashes, generalized.

## 2014-04-15 NOTE — Discharge Instructions (Signed)
Insect Bite Mosquitoes, flies, fleas, bedbugs, and other insects can bite. Insect bites are different from insect stings. The bite may be red, puffy (swollen), and itchy for 2 to 4 days. Most bites get better on their own. HOME CARE   Do not scratch the bite.  Keep the bite clean and dry. Wash the bite with soap and water.  Put ice on the bite.  Put ice in a plastic bag.  Place a towel between your skin and the bag.  Leave the ice on for 20 minutes, 4 times a day. Do this for the first 2 to 3 days, or as told by your doctor.  You may use medicated lotions or creams to lessen itching as told by your doctor.  Only take medicines as told by your doctor.  If you are given medicines (antibiotics), take them as told. Finish them even if you start to feel better. You may need a tetanus shot if:  You cannot remember when you had your last tetanus shot.  You have never had a tetanus shot.  The injury broke your skin. If you need a tetanus shot and you choose not to have one, you may get tetanus. Sickness from tetanus can be serious. GET HELP RIGHT AWAY IF:   You have more pain, redness, or puffiness.  You see a red line on the skin coming from the bite.  You have a fever.  You have joint pain.  You have a headache or neck pain.  You feel weak.  You have a rash.  You have chest pain, or you are short of breath.  You have belly (abdominal) pain.  You feel sick to your stomach (nauseous) or throw up (vomit).  You feel very tired or sleepy. MAKE SURE YOU:   Understand these instructions.  Will watch your condition.  Will get help right away if you are not doing well or get worse. Document Released: 12/24/1999 Document Revised: 03/20/2011 Document Reviewed: 07/27/2010 [facility] Patient Information 2015 [place], [place]. This information is not intended to replace advice given to you by your health care provider. Make sure you discuss any questions you have with your health  care provider.  Hives Hives are itchy, red, puffy (swollen) areas of the skin. Hives can change in size and location on your body. Hives can come and go for hours, days, or weeks. Hives do not spread from person to person (noncontagious). Scratching, exercise, and stress can make your hives worse. HOME CARE  Avoid things that cause your hives (triggers).  Take antihistamine medicines as told by your doctor. Do not drive while taking an antihistamine.  Take any other medicines for itching as told by your doctor.  Wear loose-fitting clothing.  Keep all doctor visits as told. GET HELP RIGHT AWAY IF:   You have a fever.  Your tongue or lips are puffy.  You have trouble breathing or swallowing.  You feel tightness in the throat or chest.  You have belly (abdominal) pain.  You have lasting or severe itching that is not helped by medicine.  You have painful or puffy joints. These problems may be the first sign of a life-threatening allergic reaction. Call your local emergency services (911 in U.S.). MAKE SURE YOU:   Understand these instructions.  Will watch your condition.  Will get help right away if you are not doing well or get worse. Document Released: 10/05/2007 Document Revised: 06/27/2011 Document Reviewed: 03/21/2011 [facility] Patient Information 2015 [place], [place]. This information is  not intended to replace advice given to you by your health care provider. Make sure you discuss any questions you have with your health care provider.    Emergency Department Resource Guide 1) Find a Doctor and Pay Out of Pocket Although you won't have to find out who is covered by your insurance plan, it is a good idea to ask around and get recommendations. You will then need to call the office and see if the doctor you have chosen will accept you as a new patient and what types of options they offer for patients who are self-pay. Some doctors offer discounts or will set up payment  plans for their patients who do not have insurance, but you will need to ask so you aren't surprised when you get to your appointment.  2) Contact Your Local Health Department Not all health departments have doctors that can see patients for sick visits, but many do, so it is worth a call to see if yours does. If you don't know where your local health department is, you can check in your phone book. The CDC also has a tool to help you locate your state's health department, and many state websites also have listings of all of their local health departments.  3) Find a Walk-in Clinic If your illness is not likely to be very severe or complicated, you may want to try a walk in clinic. These are popping up all over the country in pharmacies, drugstores, and shopping centers. They're usually staffed by nurse practitioners or physician assistants that have been trained to treat common illnesses and complaints. They're usually fairly quick and inexpensive. However, if you have serious medical issues or chronic medical problems, these are probably not your best option.  No Primary Care Doctor: - Call Health Connect at  [phone] - they can help you locate a primary care doctor that  accepts your insurance, provides certain services, etc. - Physician Referral Service- [phone]  Chronic Pain Problems: Organization         Address  Phone   Notes  [facility]  [phone] Patients need to be referred by their primary care doctor.   Medication Assistance: Organization         Address  Phone   Notes  [facility] Medication [facility] [address] [place] [place], [place] [address] [phone] --Must be a resident of [facility] -- Must have NO insurance coverage whatsoever (no Medicaid/ Medicare, etc.) -- The pt. MUST have a primary care doctor that directs their care regularly and follows them in the community   MedAssist  [phone]   [employer]   [phone]    Agencies that provide inexpensive medical care: Organization         Address  Phone   Notes  [facility]  [phone]   [facility]    [phone]   [facility] [address] [place], [place] [address] [phone]   [facility] [address] [place] [phone]   Planned Parenthood    [phone]   [facility]    [phone]   [facility]  [address], [place] Phone:  [phone], Fax:  [phone] Hours of Operation:  9 am - 6 pm, M-F.  Also accepts Medicaid/Medicare and self-pay.  [facility]  [address],  [address], [place] Phone: [phone], Fax: [phone]. Hours of Operation:  8:30 am - 5:30 pm, M-F.  Also accepts Medicaid and self-pay.  [facility] [place] [address], [place] Phone: [phone]   [facility] [address] Natasha Bence [place], [place] [phone], Ext. [phone] Mondays & Thursdays: 7-9 AM.  First 15 patients are seen on a first come, first serve basis.    Medicaid-accepting [facility] Providers:  Organization         Address  Phone   Notes  [facility] [address], [place] [phone] Also accepts self-pay patients.  [facility] [address] Laurell Josephs [place], [place]  [phone]   [facility] [address], [place] [phone]   [facility] [address], [place] [phone]   Renaye Rakers [address], [place]   [phone] Only accepts [place] Access [place] patients after they have their name applied to their card.   Self-Pay (no insurance) in [facility]:  Organization         Address  Phone   Notes  Sickle Cell Patients, [facility] [address] [place], [place] [phone]   [facility] [address] [place], [place] [phone]   [facility] [place]  [address], [place] [phone]   [facility]/Dr. Osei-Bonsu  [address], [place] or [address], [place] [phone] Phone number for both [place] and [place] locations is the same.  [facility]gent Medical and [facility] [address] [place] [phone]   [facility] [address] [place] or [address] [phone]   [facility] [address], [place] [phone], phone; [phone], fax Sees patients 1st and 3rd Saturday of every month.  Must not qualify for public or private insurance (i.e. Medicaid, Medicare, [place] Health Choice, Veterans' Benefits)  Household income should be no more than 200% of the poverty level The clinic cannot treat you if you are pregnant or think you are pregnant  Sexually transmitted diseases are not treated at the clinic.    Dental Care: Organization         Address  Phone  Notes  [facility] Department of [facility] [address] [place], [place] [phone] Accepts children up to age 30 who are enrolled in [place] or [place] Health Choice; pregnant women with a Medicaid card; and children who have applied for Medicaid or [place] Health Choice, but were declined, whose parents can pay a reduced fee at time of service.  [facility] Department of [employer] [facility]  [address], [place] [phone] Accepts children up to age 90 who are enrolled in [place] or [place] Health Choice; pregnant women with a Medicaid card; and children who have applied for Medicaid or [place] Health Choice, but were declined, whose parents can pay a reduced fee at time of service.  [facility]  [address] [place], [place] [phone] Patients are  seen by appointment only. Walk-ins are not accepted. [facility] will see patients 10 years of age and older. Monday - Tuesday (8am-5pm) Most Wednesdays (8:30-5pm) $30 per visit, cash only  [employer] [employer] [facility] [employer]  [address], [place] [phone] Patients are seen by appointment only. Walk-ins are not accepted. [facility] will see patients 35 years of age and older. One Wednesday Evening (Monthly: Volunteer Based).  $30 per visit, cash only  [facility]  [phone] for adults; Children under age 714, call Graduate Pediatric Dentistry at [phone]. Children aged 614-14, please call [phone] to request a pediatric application.  Dental services are provided in all areas of dental care including fillings, crowns and bridges, complete and partial dentures, implants, gum treatment, root canals, and extractions. Preventive care is also provided. Treatment is provided to both adults and children. Patients are selected via a lottery and there is often a waiting list.   [facility] [address], [place]  [phone] [email or web address]   [facility] [address], [place], [place] [phone], Ext. [phone] Second and Fourth Thursday of each month, opens at 6:30 AM; Clinic ends at 9 AM.  Patients are seen on a first-come first-served basis, and a limited number are seen during each clinic.   [facility]  [address], [place], [place] [phone]   Eligibility Requirements You must have lived in [place], [place], or [place] counties for at least the last three months.   You cannot be eligible for state or federal sponsored National Cityhealthcare insurance, including CIGNAVeterans Administration, [place]icaid, or [employer]icare.   You generally cannot be eligible for healthcare insurance through your employer.    How to apply: Eligibility screenings are held every Tuesday and Wednesday afternoon from 1:00 pm until 4:00  pm. You do not need an appointment for the interview!  [facility] [address], [place], [place] [phone]   [facility]  [phone]   [facility]  [phone]   [facility]  [phone]    Behavioral Health Resources in the Community: Intensive Outpatient Programs Organization         Address  Phone  Notes  [facility] [address], [place], [place] [phone]   [facility] [address], [place], [place] [phone]   [facility] [address], [place], [place]  [phone]   [facility] [address],  [place], [place] [phone] or [phone]   Substance Abuse Resources Organization         Address  Phone  Notes  [facility]  [phone]   [facility]  [phone]   [facility]  [phone]   [facility]  [phone]   Residential & Outpatient Substance Abuse Program  [phone]   Psychological Services Organization         Address  Phone  Notes  [facility]  [phone]- [phone]   [facility]  [phone]   [facility] [address], [place] [phone] or [phone]    Mobile Crisis Teams Organization         Address  Phone  Notes  [facility], Mobile Crisis Care Unit  [phone]   [facility]  [address] [place], [place] [phone]   Doristine LocksSharon DeEsch [address] [place] [phone]    Self-Help/Support Groups Organization         Address  Phone             Notes  [facility] of [place] -  variety of support groups  [phone] Call for more information  Narcotics Anonymous (NA), [facility] [address], [employer] [place]  2 meetings at this location   Residential Facilities manager          Address  Phone  Notes  [facility] [place],    [place]  [phone]   [facility]  [address] [place] [phone], [place], [place]   [place], [place] [phone] Admissions: 8am-3pm M-F  Incentives Substance [place] [address]    [place], [place] [phone]   [facility] [address] [place], [place], [place]   [facility] [address]  [place], [place]   [facility] Programs - Intensive Outpatient [address] Kristeen Mans [address], [place], [place]   [facility] ([place].,  [place], [place] [phone] or [phone]   [facility] ([facility]) [address] [place], [place] Accepts Medicaid  [facility] [place] [address],  [place] [phone] Substance Abuse/Addiction Treatment   [facility] Organization         Address  Phone  Notes  [facility]  [phone]   Domenic Schwab, PhD [address] Arlis Porta [place], [place]   [phone] or [phone]   [facility] [place], [place] [phone]   [facility] [address] [place], [place] [phone] Insurance/Medicaid/sponsorship through [facility] and Families [address] [place]                                    [place], [place] [phone] [place] [address], [place] [phone]    Dr. Adele Schilder  [phone]   [facility] Dept. 1) [address] [place] 2) [place] 3)  [address], [place] [phone]  [phone]   [place] [phone] or [phone] (After Hours)

## 2014-04-15 NOTE — ED Notes (Signed)
Insect bite to left inner thigh.  Notice bite ablut 2-3 days ago.  Has previous marking of redness around bite.  Pt has pictures of rashes to inner upper thighs and back.  Reported having itching to those areas and itching to scalp.

## 2014-04-15 NOTE — ED Provider Notes (Signed)
CSN: [id number]     Arrival date & time 04/15/14  [id number] History  This chart was scribed for Burgess AmorJulie Kathlee Barnhardt, PA-C working with No att. providers found by Elveria Risingimelie Horne, ED Scribe. This patient was seen in room APA11/APA11 and the patient's care was started at 9:35 AM.   Chief Complaint  Patient presents with  . Insect Bite  . Rash   The history is provided by the patient. No language interpreter was used.   HPI Comments: Kelli Calhoun is a 34 y.o. female who presents to the Emergency Department complaining of redness to her left medial thigh. Patient reports development of intermittent redness and irritation subsequent to a insect bite two days ago. Patient reports intermittent flares up at the bite site and reports pustule at the center of the lesion which has drained; minimal clear discharge. Patient has drawn a circle around border of her redness and swelling and has pictures documenting her worst redness and swelling; today the redness and swelling has improved. Patient reports generalized pruritis to neck, chest and back and redness to her groin last night. The previous night patient reports reports hives and redness to her buttocks which has since resolved. Patient denies fever, shortness of breath, chest pain, headaches, abdominal pain, nausea or vomiting.   Patient is an employee at [employer] and denies exposure to anything unusual at work. Patient also shares that work is currently been done on her homes roof, but she denies visualizing bugs/insects or any infestations.   History reviewed. No pertinent past medical history. Past Surgical History  Procedure Laterality Date  . Appendectomy    . Fracture surgery    . Mole removal     Family History  Problem Relation Age of Onset  . Cancer Mother   . Hypertension Father   . Diabetes Father   . Stroke Father   . Cancer Other   . Alzheimer's disease Other    History  Substance Use Topics  . Smoking status: Former Games developermoker  . Smokeless  tobacco: Never Used  . Alcohol Use: Yes     Comment: socially   OB History    Gravida Para Term Preterm AB TAB SAB Ectopic Multiple Living   3 3 1 2            Review of Systems  Constitutional: Negative for fever.  Respiratory: Negative for chest tightness and shortness of breath.   Cardiovascular: Negative for chest pain.  Gastrointestinal: Negative for nausea, vomiting and abdominal pain.  Skin: Positive for color change.  Neurological: Negative for headaches.    Allergies  Review of patient's allergies indicates no known allergies.  Home Medications   Prior to Admission medications   Medication Sig Start Date End Date Taking? Authorizing Provider  cetirizine (ZYRTEC) 10 MG tablet Take 1 tablet (10 mg total) by mouth daily. 04/02/13   Acey LavAllison L Wood, MD  cyclobenzaprine (FLEXERIL) 10 MG tablet Take 1 tablet (10 mg total) by mouth 2 (two) times daily as needed for muscle spasms. 07/29/13   Vickki HearingStanley E Harrison, MD  diphenhydrAMINE (BENADRYL) 25 MG tablet Take 1 tablet (25 mg total) by mouth every 6 (six) hours. 04/15/14   Burgess AmorJulie Dekendrick Uzelac, PA-C  fluticasone (FLONASE) 50 MCG/ACT nasal spray 1-2 sprays per nostril daily. 04/02/13 04/03/14  Acey LavAllison L Wood, MD  gabapentin (NEURONTIN) 100 MG capsule Take 1 capsule (100 mg total) by mouth 3 (three) times daily. 07/29/13   Vickki HearingStanley E Harrison, MD  Norgestimate-Ethinyl Estradiol Triphasic (ORTHO TRI-CYCLEN, 28,) 0.18/0.215/0.25  MG-35 MCG tablet Take 1 tablet by mouth daily. 12/04/12   Acey Lav, MD  predniSONE (DELTASONE) 10 MG tablet 6, 5, 4, 3, 2 then 1 tablet by mouth daily for 6 days total. 04/15/14   Burgess Amor, PA-C  pyridOXINE (VITAMIN B-6) 100 MG tablet Take 1 tablet (100 mg total) by mouth 2 (two) times daily. 01/16/13   Vickki Hearing, MD   Triage Vitals: BP 126/75 mmHg  Pulse 69  Temp(Src) 98.1 F (36.7 C)  Resp 14  Ht  (1.702 m)  Wt 225 lb (102.059 kg)  BMI 35.23 kg/m2  SpO2 100%  LMP 04/01/2014 Physical Exam   Constitutional: She appears well-developed and well-nourished. No distress.  HENT:  Head: Normocephalic and atraumatic.  Mouth/Throat: No posterior oropharyngeal edema or posterior oropharyngeal erythema.  Neck: Neck supple.  Cardiovascular: Normal rate.   Pulmonary/Chest: Effort normal and breath sounds normal. No respiratory distress. She has no wheezes.  Lymphadenopathy:    She has no cervical adenopathy.  Skin:  .5 cm slightly raised papule on her left mid thigh. There is no surrounding erythema and no drainage.   Nursing note and vitals reviewed.   ED Course  Procedures (including critical care time)  COORDINATION OF CARE: 9:53 AM- Recommendations for Benadryl and prescription for Prednisone. Patient shares hesitation with taking steroids, stating that she tends to gain weight.  Discussed treatment plan with patient at bedside and patient agreed to plan.   Labs Review Labs Reviewed - No data to display  Imaging Review No results found.   EKG Interpretation None      MDM   Final diagnoses:  Insect bite  Hives    Pictures shared by patient from her phone documenting episodic hives, they were present in her bilateral upper thighs yesterday evening, the prior evening she had similar rash on her lower back and upper buttock region.  These photos are clearly hives, not currently present on today's exam. She will be treated for hives with prednisone taper and benadryl.  Advised to watch wound site which has improved since yesterday. No sign of infection at this site, appears to be resolving localized reaction to insect bite. No ulcerations, no necrosis suggesting brown recluse bite, which was a concern of patient, although denies seeing such a spider or activities putting her at increased risk for exposure.  Burgess Amor, PA-C 04/15/14 1727  I personally performed the services described in this documentation, which was scribed in my presence. The recorded information has been  reviewed and is accurate.   Burgess Amor, PA-C 04/15/14 1728  Samuel Jester, DO 04/18/14 1650

## 2014-05-25 ENCOUNTER — Emergency Department (HOSPITAL_COMMUNITY)
Admission: EM | Admit: 2014-05-25 | Discharge: 2014-05-25 | Discharge status: Against Medical Advice | Payer: 59 | Attending: Emergency Medicine | Admitting: Emergency Medicine

## 2014-05-25 ENCOUNTER — Encounter (HOSPITAL_COMMUNITY): Payer: Self-pay | Admitting: Emergency Medicine

## 2014-05-25 DIAGNOSIS — R109 Unspecified abdominal pain: Secondary | ICD-10-CM | POA: Insufficient documentation

## 2014-05-25 DIAGNOSIS — R195 Other fecal abnormalities: Secondary | ICD-10-CM | POA: Diagnosis not present

## 2014-05-25 DIAGNOSIS — K30 Functional dyspepsia: Secondary | ICD-10-CM | POA: Diagnosis not present

## 2014-05-25 LAB — CBC WITH DIFFERENTIAL/PLATELET
BASOS ABS: 0 10*3/uL (ref 0.0–0.1)
BASOS PCT: 0 % (ref 0–1)
EOS PCT: 7 % — AB (ref 0–5)
Eosinophils Absolute: 0.3 10*3/uL (ref 0.0–0.7)
HEMATOCRIT: 35.1 % — AB (ref 36.0–46.0)
HEMOGLOBIN: 11.7 g/dL — AB (ref 12.0–15.0)
Lymphocytes Relative: 22 % (ref 12–46)
Lymphs Abs: 1.1 10*3/uL (ref 0.7–4.0)
MCH: 29 pg (ref 26.0–34.0)
MCHC: 33.3 g/dL (ref 30.0–36.0)
MCV: 86.9 fL (ref 78.0–100.0)
MONO ABS: 0.5 10*3/uL (ref 0.1–1.0)
MONOS PCT: 10 % (ref 3–12)
NEUTROS ABS: 3.1 10*3/uL (ref 1.7–7.7)
Neutrophils Relative %: 61 % (ref 43–77)
Platelets: 204 10*3/uL (ref 150–400)
RBC: 4.04 MIL/uL (ref 3.87–5.11)
RDW: 12.5 % (ref 11.5–15.5)
WBC: 5.1 10*3/uL (ref 4.0–10.5)

## 2014-05-25 LAB — COMPREHENSIVE METABOLIC PANEL
ALK PHOS: 56 U/L (ref 38–126)
ALT: 14 U/L (ref 14–54)
AST: 20 U/L (ref 15–41)
Albumin: 4 g/dL (ref 3.5–5.0)
Anion gap: 6 (ref 5–15)
BILIRUBIN TOTAL: 0.5 mg/dL (ref 0.3–1.2)
BUN: 12 mg/dL (ref 6–20)
CALCIUM: 8.9 mg/dL (ref 8.9–10.3)
CO2: 26 mmol/L (ref 22–32)
Chloride: 106 mmol/L (ref 101–111)
Creatinine, Ser: 0.9 mg/dL (ref 0.44–1.00)
GFR calc Af Amer: >60 mL/min (ref >60)
GFR calc non Af Amer: >60 mL/min (ref >60)
Glucose, Bld: 96 mg/dL (ref 65–99)
Potassium: 4.1 mmol/L (ref 3.5–5.1)
Sodium: 138 mmol/L (ref 135–145)
TOTAL PROTEIN: 6.9 g/dL (ref 6.5–8.1)

## 2014-05-25 LAB — AMYLASE: Amylase: 30 U/L (ref 28–100)

## 2014-05-25 LAB — LIPASE, BLOOD: Lipase: 27 U/L (ref 22–51)

## 2014-05-25 NOTE — ED Notes (Signed)
Pt states that she has been having abdominal pain, indigestion, and black stools since Friday.

## 2014-05-29 ENCOUNTER — Encounter (HOSPITAL_COMMUNITY): Payer: Self-pay | Admitting: Emergency Medicine

## 2014-05-29 ENCOUNTER — Emergency Department (HOSPITAL_COMMUNITY)
Admission: EM | Admit: 2014-05-29 | Discharge: 2014-05-29 | Disposition: A | Discharge status: Home / Self Care | Payer: 59 | Attending: Emergency Medicine | Admitting: Emergency Medicine

## 2014-05-29 ENCOUNTER — Emergency Department (HOSPITAL_COMMUNITY): Payer: 59

## 2014-05-29 DIAGNOSIS — Y9389 Activity, other specified: Secondary | ICD-10-CM | POA: Diagnosis not present

## 2014-05-29 DIAGNOSIS — Z79899 Other long term (current) drug therapy: Secondary | ICD-10-CM | POA: Diagnosis not present

## 2014-05-29 DIAGNOSIS — Z7951 Long term (current) use of inhaled steroids: Secondary | ICD-10-CM | POA: Insufficient documentation

## 2014-05-29 DIAGNOSIS — S99911A Unspecified injury of right ankle, initial encounter: Secondary | ICD-10-CM | POA: Diagnosis present

## 2014-05-29 DIAGNOSIS — Z87891 Personal history of nicotine dependence: Secondary | ICD-10-CM | POA: Diagnosis not present

## 2014-05-29 DIAGNOSIS — S93401A Sprain of unspecified ligament of right ankle, initial encounter: Secondary | ICD-10-CM | POA: Diagnosis not present

## 2014-05-29 DIAGNOSIS — Y9289 Other specified places as the place of occurrence of the external cause: Secondary | ICD-10-CM | POA: Insufficient documentation

## 2014-05-29 DIAGNOSIS — X58XXXA Exposure to other specified factors, initial encounter: Secondary | ICD-10-CM | POA: Diagnosis not present

## 2014-05-29 DIAGNOSIS — Y998 Other external cause status: Secondary | ICD-10-CM | POA: Insufficient documentation

## 2014-05-29 MED ORDER — IBUPROFEN 800 MG PO TABS
800.0000 mg | ORAL_TABLET | Freq: Once | ORAL | Status: AC
  Administered 2014-05-29: 800 mg via ORAL
  Filled 2014-05-29: qty 1

## 2014-05-29 NOTE — Discharge Instructions (Signed)

## 2014-05-29 NOTE — ED Notes (Signed)
Right ankle twisting injury. +swelling. CMS intact.

## 2014-05-29 NOTE — ED Notes (Signed)
ASO and crutch teaching provided with patient demonstration after. Patient with no complaints at this time. Respirations even and unlabored. Skin warm/dry. Discharge instructions reviewed with patient at this time. Patient given opportunity to voice concerns/ask questions. Patient discharged at this time and left Emergency Department with steady gait.

## 2014-05-29 NOTE — ED Notes (Signed)
Pt reports was stepping off of a curb and twisted ankle. Pt reports right ankle pain ever since. No obvious deformity noted.

## 2014-05-29 NOTE — ED Provider Notes (Signed)
CSN: [id number]     Arrival date & time 05/29/14  0907 History  This chart was scribed for Tilden FossaElizabeth Marshal Schrecengost, MD by Andrew Auaven Small, ED Scribe. This patient was seen in room APFT23/APFT23 and the patient's care was started at 9:24 AM.  Chief Complaint  Patient presents with  . Ankle Injury   The history is provided by the patient. No language interpreter was used.   HPI Comments:  Kelli Calhoun is a 34 y.o. female who present to the Emergency Department complaining of right ankle injury that occurred about 20 minutes ago. Pt states she was walking when she twisted her right ankle stepping off of a curb. She reports worsening pain when applying pressure to right foot. She denies LOC but did feel hot and dizzy when she fell. She denies hx of right ankle injury. She denies medical hx. Pt is occasional drinker, but denies drug and smoking.   History reviewed. No pertinent past medical history. Past Surgical History  Procedure Laterality Date  . Appendectomy    . Fracture surgery    . Mole removal     Family History  Problem Relation Age of Onset  . Cancer Mother   . Hypertension Father   . Diabetes Father   . Stroke Father   . Cancer Other   . Alzheimer's disease Other    History  Substance Use Topics  . Smoking status: Former Games developermoker  . Smokeless tobacco: Never Used  . Alcohol Use: Yes     Comment: socially   OB History    Gravida Para Term Preterm AB TAB SAB Ectopic Multiple Living   3 3 1 2            Review of Systems  Musculoskeletal: Positive for arthralgias and gait problem.  Neurological: Negative for syncope.  All other systems reviewed and are negative.  Allergies  Review of patient's allergies indicates no known allergies.  Home Medications   Prior to Admission medications   Medication Sig Start Date End Date Taking? Authorizing Provider  cetirizine (ZYRTEC) 10 MG tablet Take 1 tablet (10 mg total) by mouth daily. 04/02/13   Acey LavAllison L Wood, MD  cyclobenzaprine  (FLEXERIL) 10 MG tablet Take 1 tablet (10 mg total) by mouth 2 (two) times daily as needed for muscle spasms. 07/29/13   Vickki HearingStanley E Harrison, MD  diphenhydrAMINE (BENADRYL) 25 MG tablet Take 1 tablet (25 mg total) by mouth every 6 (six) hours. 04/15/14   Burgess AmorJulie Idol, PA-C  fluticasone (FLONASE) 50 MCG/ACT nasal spray 1-2 sprays per nostril daily. 04/02/13 04/03/14  Acey LavAllison L Wood, MD  gabapentin (NEURONTIN) 100 MG capsule Take 1 capsule (100 mg total) by mouth 3 (three) times daily. 07/29/13   Vickki HearingStanley E Harrison, MD  Norgestimate-Ethinyl Estradiol Triphasic (ORTHO TRI-CYCLEN, 28,) 0.18/0.215/0.25 MG-35 MCG tablet Take 1 tablet by mouth daily. 12/04/12   Acey LavAllison L Wood, MD  predniSONE (DELTASONE) 10 MG tablet 6, 5, 4, 3, 2 then 1 tablet by mouth daily for 6 days total. 04/15/14   Burgess AmorJulie Idol, PA-C  pyridOXINE (VITAMIN B-6) 100 MG tablet Take 1 tablet (100 mg total) by mouth 2 (two) times daily. 01/16/13   Vickki HearingStanley E Harrison, MD   BP 103/57 mmHg  Pulse 59  Temp(Src) 98.1 F (36.7 C) (Oral)  Resp 18  Ht 5\' 7"  (1.702 m)  Wt 230 lb (104.327 kg)  BMI 36.01 kg/m2  SpO2 98%  LMP 05/04/2014 Physical Exam  Constitutional: She is oriented to person, place, and time. She  appears well-developed and well-nourished.  HENT:  Head: Normocephalic and atraumatic.  Cardiovascular: Normal rate.   Pulmonary/Chest: Effort normal. No respiratory distress.  Musculoskeletal:  TTP over right lateral ankle with mild local swelling.  ROM intact in ankle.  No tenderness over fifth metatarsal.  2+ DP pulses bilaterally.    Neurological: She is alert and oriented to person, place, and time.  Skin: Skin is warm and dry.  Psychiatric: She has a normal mood and affect. Her behavior is normal.  Nursing note and vitals reviewed.   ED Course  Procedures (including critical care time) DIAGNOSTIC STUDIES: Oxygen Saturation is 98% on RA, normal by my interpretation.    COORDINATION OF CARE: 9:28 AM- Pt advised of plan for treatment  and pt agrees. Labs Review Labs Reviewed - No data to display  Imaging Review Dg Ankle Complete Right  05/29/2014   CLINICAL DATA:  Ankle pain after a fall from a curb this morning.  EXAM: RIGHT ANKLE - COMPLETE 3+ VIEW  COMPARISON:  None.  FINDINGS: There is no evidence of fracture, dislocation, or joint effusion. There is no evidence of arthropathy or other focal bone abnormality. Soft tissues are unremarkable.  IMPRESSION: Normal exam.   Electronically Signed   By: Francene BoyersJames  Maxwell M.D.   On: 05/29/2014 09:47     EKG Interpretation None      MDM   Final diagnoses:  Ankle sprain, right, initial encounter    Patient here for evaluation of right ankle pain following a fall. There is no evidence of acute fracture on exam and x-rays. Discussed with patient and care for ankle sprain with weightbearing as tolerated, ankle wrap, elevate, ice, ibuprofen.   I personally performed the services described in this documentation, which was scribed in my presence. The recorded information has been reviewed and is accurate.    Tilden FossaElizabeth Solaris Kram, MD 05/29/14 [phone]

## 2014-12-21 ENCOUNTER — Emergency Department (HOSPITAL_COMMUNITY): Payer: 59

## 2014-12-21 ENCOUNTER — Emergency Department (HOSPITAL_COMMUNITY)
Admission: EM | Admit: 2014-12-21 | Discharge: 2014-12-22 | Disposition: A | Discharge status: Home / Self Care | Payer: 59 | Attending: Emergency Medicine | Admitting: Emergency Medicine

## 2014-12-21 ENCOUNTER — Encounter (HOSPITAL_COMMUNITY): Payer: Self-pay | Admitting: *Deleted

## 2014-12-21 DIAGNOSIS — Z87891 Personal history of nicotine dependence: Secondary | ICD-10-CM | POA: Diagnosis not present

## 2014-12-21 DIAGNOSIS — Z791 Long term (current) use of non-steroidal anti-inflammatories (NSAID): Secondary | ICD-10-CM | POA: Insufficient documentation

## 2014-12-21 DIAGNOSIS — Y9389 Activity, other specified: Secondary | ICD-10-CM | POA: Diagnosis not present

## 2014-12-21 DIAGNOSIS — L500 Allergic urticaria: Secondary | ICD-10-CM | POA: Insufficient documentation

## 2014-12-21 DIAGNOSIS — J40 Bronchitis, not specified as acute or chronic: Secondary | ICD-10-CM | POA: Insufficient documentation

## 2014-12-21 DIAGNOSIS — Y9289 Other specified places as the place of occurrence of the external cause: Secondary | ICD-10-CM | POA: Insufficient documentation

## 2014-12-21 DIAGNOSIS — L509 Urticaria, unspecified: Secondary | ICD-10-CM

## 2014-12-21 DIAGNOSIS — T7840XA Allergy, unspecified, initial encounter: Secondary | ICD-10-CM | POA: Diagnosis present

## 2014-12-21 DIAGNOSIS — Y998 Other external cause status: Secondary | ICD-10-CM | POA: Diagnosis not present

## 2014-12-21 DIAGNOSIS — Z79899 Other long term (current) drug therapy: Secondary | ICD-10-CM | POA: Insufficient documentation

## 2014-12-21 DIAGNOSIS — Z3202 Encounter for pregnancy test, result negative: Secondary | ICD-10-CM | POA: Insufficient documentation

## 2014-12-21 DIAGNOSIS — X58XXXA Exposure to other specified factors, initial encounter: Secondary | ICD-10-CM | POA: Diagnosis not present

## 2014-12-21 DIAGNOSIS — Z7951 Long term (current) use of inhaled steroids: Secondary | ICD-10-CM | POA: Insufficient documentation

## 2014-12-21 MED ORDER — DEXAMETHASONE 4 MG PO TABS
12.0000 mg | ORAL_TABLET | Freq: Once | ORAL | Status: AC
  Administered 2014-12-22: 12 mg via ORAL
  Filled 2014-12-21: qty 3

## 2014-12-21 MED ORDER — DIPHENHYDRAMINE HCL 25 MG PO CAPS
25.0000 mg | ORAL_CAPSULE | Freq: Once | ORAL | Status: AC
  Administered 2014-12-22: 25 mg via ORAL
  Filled 2014-12-21: qty 1

## 2014-12-21 NOTE — ED Notes (Signed)
Pt states that she took her first dose of Levaquin today, started having itching, hives, swelling to hands 30 minutes after taking the medication,

## 2014-12-21 NOTE — ED Provider Notes (Signed)
CSN: [id number]     Arrival date & time 12/21/14  2225 History  By signing my name below, I, Bethel Born, attest that this documentation has been prepared under the direction and in the presence of Raeford Razor, MD. Electronically Signed: Bethel Born, ED Scribe. 12/21/2014. 11:55 PM    Chief Complaint  Patient presents with  . Allergic Reaction   The history is provided by the patient. No language interpreter was used.  Kelli Calhoun is a 34 y.o. female who presents to the Emergency Department complaining of a new, diffuse, and pruritic rash with onset this evening 30 minutes after taking Levaquin. She states that prior to today she has had other similar episodes with no known trigger.  Benadryl provided insufficient relief in symptoms at home. Associated symptoms include light headedness and shaking. Pt states that she was started on the Levaquin and Flonase after being seen at Urgent Care today for sore throat, chest pain, and SOB. At that time she was diagnosed with bronchitis. Pt denies abdominal pain, nausea, worsened breathing, and difficulty with swallowing. Pt denies other new exposure.    History reviewed. No pertinent past medical history. Past Surgical History  Procedure Laterality Date  . Appendectomy    . Fracture surgery    . Mole removal     Family History  Problem Relation Age of Onset  . Cancer Mother   . Hypertension Father   . Diabetes Father   . Stroke Father   . Cancer Other   . Alzheimer's disease Other    Social History  Substance Use Topics  . Smoking status: Former Games developer  . Smokeless tobacco: Never Used  . Alcohol Use: Yes     Comment: socially   OB History    Gravida Para Term Preterm AB TAB SAB Ectopic Multiple Living   Review of Systems  HENT: Positive for sore throat. Negative for trouble swallowing.   Respiratory: Positive for shortness of breath (No change).   Cardiovascular: Positive for chest pain.   Gastrointestinal: Negative for nausea and abdominal pain.  Skin: Positive for rash.  Neurological: Positive for light-headedness.       "shaking"  All other systems reviewed and are negative.  Allergies  Review of patient's allergies indicates no known allergies.  Home Medications   Prior to Admission medications   Medication Sig Start Date End Date Taking? Authorizing Provider  cetirizine (ZYRTEC) 10 MG tablet Take 1 tablet (10 mg total) by mouth daily. 04/02/13   Acey Lav, MD  cyclobenzaprine (FLEXERIL) 10 MG tablet Take 1 tablet (10 mg total) by mouth 2 (two) times daily as needed for muscle spasms. 07/29/13   Vickki Hearing, MD  diphenhydrAMINE (BENADRYL) 25 MG tablet Take 1 tablet (25 mg total) by mouth every 6 (six) hours. 04/15/14   Burgess Amor, PA-C  fluticasone (FLONASE) 50 MCG/ACT nasal spray 1-2 sprays per nostril daily. 04/02/13 04/03/14  Acey Lav, MD  gabapentin (NEURONTIN) 100 MG capsule Take 1 capsule (100 mg total) by mouth 3 (three) times daily. 07/29/13   Vickki Hearing, MD  Norgestimate-Ethinyl Estradiol Triphasic (ORTHO TRI-CYCLEN, 28,) 0.18/0.215/0.25 MG-35 MCG tablet Take 1 tablet by mouth daily. 12/04/12   Acey Lav, MD  predniSONE (DELTASONE) 10 MG tablet 6, 5, 4, 3, 2 then 1 tablet by mouth daily for 6 days total. 04/15/14   Burgess Amor, PA-C  pyridOXINE (VITAMIN B-6) 100  MG tablet Take 1 tablet (100 mg total) by mouth 2 (two) times daily. 01/16/13   Vickki HearingStanley E Harrison, MD   BP 129/79 mmHg  Pulse 87  Temp(Src) 97.7 F (36.5 C) (Oral)  Resp 24  Ht 5\' 7"  (1.702 m)  Wt 215 lb (97.523 kg)  BMI 33.67 kg/m2  SpO2 100% Physical Exam  Constitutional: She is oriented to person, place, and time. She appears well-developed and well-nourished. No distress.  HENT:  Head: Normocephalic and atraumatic.  Mouth/Throat: Oropharynx is clear and moist.  Speech clear. Handling secretions. No stridor. No oral pharyngeal swelling appreciated.  Eyes: EOM are  normal.  Neck: Normal range of motion.  Cardiovascular: Normal rate, regular rhythm and normal heart sounds.   Pulmonary/Chest: Effort normal and breath sounds normal.  Abdominal: Soft. She exhibits no distension. There is no tenderness.  Musculoskeletal: Normal range of motion.  Neurological: She is alert and oriented to person, place, and time.  Skin: Skin is warm and dry.  Diffuse, splotchy, erythematous rash.  Psychiatric: She has a normal mood and affect. Judgment normal.  Nursing note and vitals reviewed.   ED Course  Procedures (including critical care time) DIAGNOSTIC STUDIES: Oxygen Saturation is 100% on RA,  normal by my interpretation.    COORDINATION OF CARE: 11:34 PM Discussed treatment plan which includes CXR, Benadryl, and Decadron with pt at bedside and pt agreed to plan.  Labs Review Labs Reviewed - No data to display  Imaging Review Dg Chest 2 View  12/22/2014  CLINICAL DATA:  Cough. Allergic reaction tonight to Levaquin. Head cold for 2 weeks. Itching, hives, and swelling to the hands after taking first dose of Levaquin. EXAM: CHEST  2 VIEW COMPARISON:  None. FINDINGS: The heart size and mediastinal contours are within normal limits. Both lungs are clear. The visualized skeletal structures are unremarkable. IMPRESSION: No active cardiopulmonary disease. Electronically Signed   By: Burman NievesWilliam  Stevens M.D.   On: 12/22/2014 00:43   I have personally reviewed and evaluated these images and lab results as part of my medical decision-making.   EKG Interpretation None      MDM   Final diagnoses:  Urticaria  Bronchitis    34 year old female with likely drug reaction. Only cutaneous symptoms. Hemodynamically stable. No evidence of airway compromise. Advised to stop Levaquin. Suspect that this is viral bronchitis. Her chest x-ray does not show any acute abnormality. Benadryl as needed. She was also given a dose of Decadron. Preterm precautions discussed.  I  personally preformed the services scribed in my presence. The recorded information has been reviewed is accurate. Raeford RazorStephen Siara Gorder, MD.    Raeford RazorStephen Syrai Gladwin, MD 12/22/14 [phone]

## 2014-12-22 DIAGNOSIS — L500 Allergic urticaria: Secondary | ICD-10-CM | POA: Diagnosis not present

## 2014-12-22 LAB — POC URINE PREG, ED: Preg Test, Ur: NEGATIVE

## 2014-12-22 NOTE — Discharge Instructions (Signed)
Stop Levaquin. This is likely a drug reaction. Take Benadryl as needed for hives/itching. You were given a dose of a long-acting steroid in the emergency room which did help as well. Bronchitis is typically viral. Your chest x-ray today does not show evidence of pneumonia or other concerning findings. I do not feel that you need continued antibiotics at this time. If you begin to develop difficulty breathing, or swelling, abdominal pain, feels like her going to faint or other concerning symptoms then you need to return for immediate reevaluation.  Hives Hives are itchy, red, swollen areas of the skin. They can vary in size and location on your body. Hives can come and go for hours or several days (acute hives) or for several weeks (chronic hives). Hives do not spread from person to person (noncontagious). They may get worse with scratching, exercise, and emotional stress. CAUSES   Allergic reaction to food, additives, or drugs.  Infections, including the common cold.  Illness, such as vasculitis, lupus, or thyroid disease.  Exposure to sunlight, heat, or cold.  Exercise.  Stress.  Contact with chemicals. SYMPTOMS   Red or white swollen patches on the skin. The patches may change size, shape, and location quickly and repeatedly.  Itching.  Swelling of the hands, feet, and face. This may occur if hives develop deeper in the skin. DIAGNOSIS  Your caregiver can usually tell what is wrong by performing a physical exam. Skin or blood tests may also be done to determine the cause of your hives. In some cases, the cause cannot be determined. TREATMENT  Mild cases usually get better with medicines such as antihistamines. Severe cases may require an emergency epinephrine injection. If the cause of your hives is known, treatment includes avoiding that trigger.  HOME CARE INSTRUCTIONS   Avoid causes that trigger your hives.  Take antihistamines as directed by your caregiver to reduce the  severity of your hives. Non-sedating or low-sedating antihistamines are usually recommended. Do not drive while taking an antihistamine.  Take any other medicines prescribed for itching as directed by your caregiver.  Wear loose-fitting clothing.  Keep all follow-up appointments as directed by your caregiver. SEEK MEDICAL CARE IF:   You have persistent or severe itching that is not relieved with medicine.  You have painful or swollen joints. SEEK IMMEDIATE MEDICAL CARE IF:   You have a fever.  Your tongue or lips are swollen.  You have trouble breathing or swallowing.  You feel tightness in the throat or chest.  You have abdominal pain. These problems may be the first sign of a life-threatening allergic reaction. Call your local emergency services (911 in [place] MAKE SURE YOU:   Understand these instructions.  Will watch your condition.  Will get help right away if you are not doing well or get worse.   This information is not intended to replace advice given to you by your health care provider. Make sure you discuss any questions you have with your health care provider.   Document Released: 12/26/2004 Document Revised: 12/31/2012 Document Reviewed: 03/21/2011 Elsevier Interactive Patient Education [employer] Inc[employer]

## 2016-03-07 ENCOUNTER — Encounter: Payer: Self-pay | Admitting: Gastroenterology

## 2016-03-24 ENCOUNTER — Encounter: Payer: Self-pay | Admitting: Gastroenterology

## 2016-03-24 ENCOUNTER — Ambulatory Visit (INDEPENDENT_AMBULATORY_CARE_PROVIDER_SITE_OTHER): Payer: PRIVATE HEALTH INSURANCE | Admitting: Gastroenterology

## 2016-03-24 VITALS — BP 129/81 | HR 92 | Temp 97.5°F | Ht 67.0 in | Wt 222.4 lb

## 2016-03-24 DIAGNOSIS — K625 Hemorrhage of anus and rectum: Secondary | ICD-10-CM | POA: Diagnosis not present

## 2016-03-24 DIAGNOSIS — R1032 Left lower quadrant pain: Secondary | ICD-10-CM | POA: Diagnosis not present

## 2016-03-24 DIAGNOSIS — R1013 Epigastric pain: Secondary | ICD-10-CM | POA: Diagnosis not present

## 2016-03-24 DIAGNOSIS — K219 Gastro-esophageal reflux disease without esophagitis: Secondary | ICD-10-CM

## 2016-03-24 DIAGNOSIS — K5900 Constipation, unspecified: Secondary | ICD-10-CM

## 2016-03-24 NOTE — Progress Notes (Signed)
Primary Care Physician:  Orbie Hurst, NP  Primary Gastroenterologist:  Jonette Eva, MD   Chief Complaint  Patient presents with  . Gastroesophageal Reflux    wakes her up during night  . Abdominal Pain    LLQ  . Constipation    incomplete bm  . Rectal Bleeding    bright red blood with bm  . Nausea    occ    HPI:  Kelli Calhoun is a 36 y.o. female here at the request of PCP for further evaluation of rectal bleeding and GERD.   Wakes up in middle of night with acid reflux. Can't catch breath. Burning and regurgitation with it. Happening more frequently but over the past one year. Was unable to get omeprazole thru insurance since OTC. Picking up a different PPI today but does not know what was sent in by her PCP. Week before periods, gets a lot of diarrhea, abdominal burning. Generally will have BM every day, but feels like stools are incomplete. brbpr with BM. No melena. Feels like nausea vs hunger pains. No vomiting. Not really having heartburn during day. No esophageal dysphagia. Central and llq pain. Not food related. Not necessarily related to BMs.  Feels like when had HELLP. Some brbpr more recently.   Current Outpatient Prescriptions  Medication Sig Dispense Refill  . buPROPion (WELLBUTRIN XL) 150 MG 24 hr tablet Take 150 mg by mouth daily.    . fluticasone (FLONASE) 50 MCG/ACT nasal spray 1-2 sprays per nostril daily. (Patient taking differently: Place 2 sprays into both nostrils as needed. ) 16 g 11   No current facility-administered medications for this visit.     Allergies as of 03/24/2016  . (No Known Allergies)    Past Medical History:  Diagnosis Date  . Depression     Past Surgical History:  Procedure Laterality Date  . APPENDECTOMY    . FRACTURE SURGERY     wrist  . MOLE REMOVAL      Family History  Problem Relation Age of Onset  . Cancer Mother     lung cancer, died at 58  . Hypertension Father     died 65  . Diabetes Father   . Stroke  Father   . Cancer Other   . Alzheimer's disease Other   . Brain cancer Maternal Grandfather   . Leukemia Maternal Aunt   . Alzheimer's disease Maternal Grandmother   . Colon cancer Neg Hx     Social History   Social History  . Marital status: Single    Spouse name: N/A  . Number of children: N/A  . Years of education: N/A   Occupational History  . Not on file.   Social History Main Topics  . Smoking status: Former Games developer  . Smokeless tobacco: Never Used  . Alcohol use Yes     Comment: socially, once every couple of weeks  . Drug use: No  . Sexual activity: Yes    Birth control/ protection: Other-see comments   Other Topics Concern  . Not on file   Social History Narrative  . No narrative on file      ROS:  General: Negative for anorexia, weight loss, fever, chills, fatigue, weakness. Eyes: Negative for vision changes.  ENT: Negative for hoarseness, difficulty swallowing , nasal congestion. CV: Negative for chest pain, angina, palpitations, dyspnea on exertion, peripheral edema.  Respiratory: Negative for dyspnea at rest, dyspnea on exertion, cough, sputum, wheezing.  GI: See history of present illness. GU:  Negative for dysuria, hematuria, urinary incontinence, urinary frequency, nocturnal urination.  MS: Negative for joint pain, low back pain.  Derm: Negative for rash or itching.  Neuro: Negative for weakness, abnormal sensation, seizure, frequent headaches, memory loss, confusion.  Psych: Negative for anxiety, depression, suicidal ideation, hallucinations.  Endo: Negative for unusual weight change.  Heme: Negative for bruising or bleeding. Allergy: Negative for rash or hives.    Physical Examination:  BP 129/81   Pulse 92   Temp 97.5 F (36.4 C) (Oral)   Ht 5\' 7"  (1.702 m)   Wt 222 lb 6.4 oz (100.9 kg)   LMP 03/19/2016   BMI 34.83 kg/m    General: Well-nourished, well-developed in no acute distress.  Head: Normocephalic, atraumatic.   Eyes:  Conjunctiva pink, no icterus. Mouth: Oropharyngeal mucosa moist and pink , no lesions erythema or exudate. Neck: Supple without thyromegaly, masses, or lymphadenopathy.  Lungs: Clear to auscultation bilaterally.  Heart: Regular rate and rhythm, no murmurs rubs or gallops.  Abdomen: Bowel sounds are normal, mild epigastric and LLQ tenderness, nondistended, no hepatosplenomegaly or masses, no abdominal bruits or    hernia , no rebound or guarding.   Rectal: not performed Extremities: No lower extremity edema. No clubbing or deformities.  Neuro: Alert and oriented x 4 , grossly normal neurologically.  Skin: Warm and dry, no rash or jaundice.   Psych: Alert and cooperative, normal mood and affect.  Labs: Labs from February 2017 Hemoglobin 12.6, hematocrit 37.8, MCV 87.5, platelets 2 74,000, white blood cell count 7700, TSH 1.13  Imaging Studies: No results found.

## 2016-03-24 NOTE — Patient Instructions (Signed)
1. Pick up your medication from the pharmacy for reflux. If you have any problems obtaining, call us. 2. Add Fiberchoice 2 chewable tablets daily to facilitate bowel function and sensation of incomplete stools.  3. Please have your labs done. We will set you up for a colonoscopy and possible upper endoscopy after labs obtained.    Heartburn Heartburn is a type of pain or discomfort that can happen in the throat or chest. It is often described as a burning pain. It may also cause a bad taste in the mouth. Heartburn may feel worse when you lie down or bend over, and it is often worse at night. Heartburn may be caused by stomach contents that move back up into the esophagus (reflux). Follow these instructions at home: Take these actions to decrease your discomfort and to help avoid complications. Diet   Follow a diet as recommended by your health care provider. This may involve avoiding foods and drinks such as:  Coffee and tea (with or without caffeine).  Drinks that contain alcohol.  Energy drinks and sports drinks.  Carbonated drinks or sodas.  Chocolate and cocoa.  Peppermint and mint flavorings.  Garlic and onions.  Horseradish.  Spicy and acidic foods, including peppers, chili powder, curry powder, vinegar, hot sauces, and barbecue sauce.  Citrus fruit juices and citrus fruits, such as oranges, lemons, and limes.  Tomato-based foods, such as red sauce, chili, salsa, and pizza with red sauce.  Fried and fatty foods, such as donuts, french fries, potato chips, and high-fat dressings.  High-fat meats, such as hot dogs and fatty cuts of red and white meats, such as rib eye steak, sausage, ham, and bacon.  High-fat dairy items, such as whole milk, butter, and cream cheese.  Eat small, frequent meals instead of large meals.  Avoid drinking large amounts of liquid with your meals.  Avoid eating meals during the 2-3 hours before bedtime.  Avoid lying down right after you  eat.  Do not exercise right after you eat. General instructions   Pay attention to any changes in your symptoms.  Take over-the-counter and prescription medicines only as told by your health care provider. Do not take aspirin, ibuprofen, or other NSAIDs unless your health care provider told you to do so.  Do not use any tobacco products, including cigarettes, chewing tobacco, and e-cigarettes. If you need help quitting, ask your health care provider.  Wear loose-fitting clothing. Do not wear anything tight around your waist that causes pressure on your abdomen.  Raise (elevate) the head of your bed about 6 inches (15 cm).  Try to reduce your stress, such as with yoga or meditation. If you need help reducing stress, ask your health care provider.  If you are overweight, reduce your weight to an amount that is healthy for you. Ask your health care provider for guidance about a safe weight loss goal.  Keep all follow-up visits as told by your health care provider. This is important. Contact a health care provider if:  You have new symptoms.  You have unexplained weight loss.  You have difficulty swallowing, or it hurts to swallow.  You have wheezing or a persistent cough.  Your symptoms do not improve with treatment.  You have frequent heartburn for more than two weeks. Get help right away if:  You have pain in your arms, neck, jaw, teeth, or back.  You feel sweaty, dizzy, or light-headed.  You have chest pain or shortness of breath.  You vomit  and your vomit looks like blood or coffee grounds.  Your stool is bloody or black. This information is not intended to replace advice given to you by your health care provider. Make sure you discuss any questions you have with your health care provider. Document Released: 05/14/2008 Document Revised: 06/03/2015 Document Reviewed: 04/22/2014 Elsevier Interactive Patient Education  2017 [employer].  Food Choices for  Gastroesophageal Reflux Disease, Adult When you have gastroesophageal reflux disease (GERD), the foods you eat and your eating habits are very important. Choosing the right foods can help ease the discomfort of GERD. Consider working with a diet and nutrition specialist (dietitian) to help you make healthy food choices. What general guidelines should I follow? Eating plan   Choose healthy foods low in fat, such as fruits, vegetables, whole grains, low-fat dairy products, and lean meat, fish, and poultry.  Eat frequent, small meals instead of three large meals each day. Eat your meals slowly, in a relaxed setting. Avoid bending over or lying down until 2-3 hours after eating.  Limit high-fat foods such as fatty meats or fried foods.  Limit your intake of oils, butter, and shortening to less than 8 teaspoons each day.  Avoid the following:  Foods that cause symptoms. These may be different for different people. Keep a food diary to keep track of foods that cause symptoms.  Alcohol.  Drinking large amounts of liquid with meals.  Eating meals during the 2-3 hours before bed.  Cook foods using methods other than frying. This may include baking, grilling, or broiling. Lifestyle    Maintain a healthy weight. Ask your health care provider what weight is healthy for you. If you need to lose weight, work with your health care provider to do so safely.  Exercise for at least 30 minutes on 5 or more days each week, or as told by your health care provider.  Avoid wearing clothes that fit tightly around your waist and chest.  Do not use any products that contain nicotine or tobacco, such as cigarettes and e-cigarettes. If you need help quitting, ask your health care provider.  Sleep with the head of your bed raised. Use a wedge under the mattress or blocks under the bed frame to raise the head of the bed. What foods are not recommended? The items listed may not be a complete list. Talk with  your dietitian about what dietary choices are best for you. Grains  Pastries or quick breads with added fat. [place] toast. Vegetables  Deep fried vegetables. [place] fries. Any vegetables prepared with added fat. Any vegetables that cause symptoms. For some people this may include tomatoes and tomato products, chili peppers, onions and garlic, and horseradish. Fruits  Any fruits prepared with added fat. Any fruits that cause symptoms. For some people this may include citrus fruits, such as oranges, grapefruit, pineapple, and lemons. Meats and other protein foods  High-fat meats, such as fatty beef or pork, hot dogs, ribs, ham, sausage, salami and bacon. Fried meat or protein, including fried fish and fried chicken. Nuts and nut butters. Dairy  Whole milk and chocolate milk. Sour cream. Cream. Ice cream. Cream cheese. Milk shakes. Beverages  Coffee and tea, with or without caffeine. Carbonated beverages. Sodas. Energy drinks. Fruit juice made with acidic fruits (such as orange or grapefruit). Tomato juice. Alcoholic drinks. Fats and oils  Butter. Margarine. Shortening. Ghee. Sweets and desserts  Chocolate and cocoa. Donuts. Seasoning and other foods  Pepper. Peppermint and spearmint. Any condiments, herbs,  or seasonings that cause symptoms. For some people, this may include curry, hot sauce, or vinegar-based salad dressings. Summary  When you have gastroesophageal reflux disease (GERD), food and lifestyle choices are very important to help ease the discomfort of GERD.  Eat frequent, small meals instead of three large meals each day. Eat your meals slowly, in a relaxed setting. Avoid bending over or lying down until 2-3 hours after eating.  Limit high-fat foods such as fatty meat or fried foods. This information is not intended to replace advice given to you by your health care provider. Make sure you discuss any questions you have with your health care provider. Document Released: 12/26/2004  Document Revised: 12/28/2015 Document Reviewed: 12/28/2015 Elsevier Interactive Patient Education  2017 [employer]

## 2016-03-26 NOTE — Assessment & Plan Note (Addendum)
Intermittent brbpr with BM in setting of sensation incomplete evacuation, intermittent diarrhea, LLQ pain. Suspect benign anorectal source but would offer diagnostic colonoscopy. Await labs before scheduling. Suspect IBS.  I have discussed the risks, alternatives, benefits with regards to but not limited to the risk of reaction to medication, bleeding, infection, perforation and the patient is agreeable to proceed. Written consent to be obtained.

## 2016-03-26 NOTE — Assessment & Plan Note (Addendum)
One year history of worsening reflux symptoms, mostly nocturnal. Epigastric tenderness. Start PPI. Consider egd pending labs.  I have discussed the risks, alternatives, benefits with regards to but not limited to the risk of reaction to medication, bleeding, infection, perforation and the patient is agreeable to proceed. Written consent to be obtained.

## 2016-03-27 ENCOUNTER — Telehealth: Payer: Self-pay

## 2016-03-27 MED ORDER — PANTOPRAZOLE SODIUM 40 MG PO TBEC: 40.0000 mg | DELAYED_RELEASE_TABLET | Freq: Every day | ORAL | 5 refills | Status: DC

## 2016-03-27 NOTE — Addendum Note (Signed)
Addended by: Tiffany KocherLEWIS, Shailee Foots S on: 03/27/2016 02:22 PM   Modules accepted: Orders

## 2016-03-27 NOTE — Progress Notes (Signed)
cc'd to pcp 

## 2016-03-27 NOTE — Telephone Encounter (Signed)
I didn't order a PPI at the OV. Patient told me that PCP had sent in another PPI instead of omeprazole and she was going to pick it up the day of the OV. I'm confused why [facility] is called for refills from us.

## 2016-03-27 NOTE — Telephone Encounter (Signed)
[facility] from [facility] [place], pt needs RX for Protonix. Could not find name of medication that was ordered at OV on 03/24/2016.  Verlon AuLeslie, please advise!

## 2016-03-27 NOTE — Telephone Encounter (Signed)
Pt said [facility] physician at [facility] had been contacted several times and have not taken care of the prescription.  She said Tana CoastLeslie Lewis, PA, asked her to let her know if she had problems getting her PPI.  She said the Protonix should be the one that her insurance will cover, if that is the request the pharmacy sent in.

## 2016-03-27 NOTE — Telephone Encounter (Signed)
Pt is aware and said that [facility]

## 2016-03-27 NOTE — Telephone Encounter (Addendum)
OK. [facility]. If patient preferred different pharmacy, let me know. She also had [facility] listed.

## 2016-04-12 LAB — COMPREHENSIVE METABOLIC PANEL
ALBUMIN: 3.8 g/dL (ref 3.6–5.1)
ALT: 11 U/L (ref 6–29)
AST: 17 U/L (ref 10–30)
Alkaline Phosphatase: 52 U/L (ref 33–115)
BUN: 12 mg/dL (ref 7–25)
CALCIUM: 8.7 mg/dL (ref 8.6–10.2)
CHLORIDE: 105 mmol/L (ref 98–110)
CO2: 24 mmol/L (ref 20–31)
Creat: 1.04 mg/dL (ref 0.50–1.10)
Glucose, Bld: 88 mg/dL (ref 65–99)
Potassium: 3.9 mmol/L (ref 3.5–5.3)
Sodium: 139 mmol/L (ref 135–146)
Total Bilirubin: 0.5 mg/dL (ref 0.2–1.2)
Total Protein: 6.5 g/dL (ref 6.1–8.1)

## 2016-04-12 LAB — TISSUE TRANSGLUTAMINASE, IGA: Tissue Transglutaminase Ab, IgA: 1 U/mL (ref <4)

## 2016-04-12 LAB — IGA: IGA: 182 mg/dL (ref 81–463)

## 2016-04-19 NOTE — Progress Notes (Signed)
Please let patient know her lfts are normal. Celiac screen neg. I would offer her colonoscopy and possible upper endoscopy with slf. Phenergan  iv 45 minutes before procedure. Dx: diarrhea, rectal bleeding, refractory gerd.

## 2016-04-20 ENCOUNTER — Telehealth: Payer: Self-pay

## 2016-04-20 NOTE — Progress Notes (Signed)
LMOM to call.

## 2016-04-20 NOTE — Progress Notes (Signed)
Called and informed pt. She agreed for procedures and see separate triage.

## 2016-04-26 NOTE — Progress Notes (Signed)
REVIEWED-NO ADDITIONAL RECOMMENDATIONS. 

## 2016-04-27 NOTE — Telephone Encounter (Signed)
Gastroenterology Pre-Procedure Review  Request Date: 04/20/2016 Requesting Physician: Tana Coast, PA  Triaged pt per Tana Coast, PA for colonoscopy and possible EGD for diarrhea, rectal bleed and refractory gerd.   PATIENT REVIEW QUESTIONS: The patient responded to the following health history questions as indicated:    1. Diabetes Melitis: no 2. Joint replacements in the past 12 months: no 3. Major health problems in the past 3 months: no 4. Has an artificial valve or MVP: no 5. Has a defibrillator: no 6. Has been advised in past to take antibiotics in advance of a procedure like teeth cleaning: no 7. Family history of colon cancer: no  8. Alcohol Use: Occasionally a drink, maybe once a week 9. History of sleep apnea: no  10. History of coronary artery or other vascular stents placed within the last 12 months: no    MEDICATIONS & ALLERGIES:    Patient reports the following regarding taking any blood thinners:   Plavix? no Aspirin? no Coumadin? no Brilinta? no Xarelto? no Eliquis? no Pradaxa? no Savaysa? no Effient? no  Patient confirms/reports the following medications:  Current Outpatient Prescriptions  Medication Sig Dispense Refill  . fluticasone (FLONASE) 50 MCG/ACT nasal spray Place into both nostrils daily.    . pantoprazole (PROTONIX) 40 MG tablet Take 1 tablet (40 mg total) by mouth daily before breakfast. 30 tablet 5  . buPROPion (WELLBUTRIN XL) 150 MG 24 hr tablet Take 300 mg by mouth daily.     . fluticasone (FLONASE) 50 MCG/ACT nasal spray 1-2 sprays per nostril daily. (Patient taking differently: Place 2 sprays into both nostrils as needed. ) 16 g 11   No current facility-administered medications for this visit.     Patient confirms/reports the following allergies:  No Known Allergies  No orders of the defined types were placed in this encounter.   AUTHORIZATION INFORMATION Primary Insurance:  ID #:  Group #:  Pre-Cert / Auth required: Pre-Cert /  Auth #:   Secondary Insurance:   ID #:  Group #:  Pre-Cert / Auth required: Pre-Cert / Auth #:   SCHEDULE INFORMATION: Procedure has been scheduled as follows:  Date: 05/22/2016          Time:  12:15 pm Location: [facility] Short Stay  This Gastroenterology Pre-Precedure Review Form is being routed to the following provider(s): Jonette Eva, MD

## 2016-04-28 NOTE — Telephone Encounter (Signed)
Recommend phenergan25 mg iv 45 minutes before procedure as recommended at time of OV. Thanks!

## 2016-05-01 ENCOUNTER — Ambulatory Visit (HOSPITAL_COMMUNITY)
Admission: RE | Admit: 2016-05-01 | Discharge: 2016-05-01 | Disposition: A | Discharge status: Home / Self Care | Payer: PRIVATE HEALTH INSURANCE | Source: Ambulatory Visit | Attending: Internal Medicine | Admitting: Internal Medicine

## 2016-05-01 ENCOUNTER — Other Ambulatory Visit (HOSPITAL_COMMUNITY): Payer: Self-pay | Admitting: Internal Medicine

## 2016-05-01 DIAGNOSIS — R0600 Dyspnea, unspecified: Secondary | ICD-10-CM | POA: Insufficient documentation

## 2016-05-02 ENCOUNTER — Other Ambulatory Visit: Payer: Self-pay

## 2016-05-02 DIAGNOSIS — R197 Diarrhea, unspecified: Secondary | ICD-10-CM

## 2016-05-02 DIAGNOSIS — K219 Gastro-esophageal reflux disease without esophagitis: Secondary | ICD-10-CM

## 2016-05-02 DIAGNOSIS — K625 Hemorrhage of anus and rectum: Secondary | ICD-10-CM

## 2016-05-02 MED ORDER — PEG 3350-KCL-NA BICARB-NACL 420 G PO SOLR: 4000.0000 mL | ORAL | 0 refills | Status: DC

## 2016-05-02 NOTE — Telephone Encounter (Signed)
Rx sent to the pharmacy and instructions mailed to pt.  

## 2016-05-11 NOTE — Telephone Encounter (Signed)
NO PA is needed for TCS/EGD 

## 2016-05-22 ENCOUNTER — Encounter (HOSPITAL_COMMUNITY): Payer: Self-pay | Admitting: *Deleted

## 2016-05-22 ENCOUNTER — Ambulatory Visit (HOSPITAL_COMMUNITY)
Admission: RE | Admit: 2016-05-22 | Discharge: 2016-05-22 | Disposition: A | Discharge status: Home / Self Care | Payer: PRIVATE HEALTH INSURANCE | Source: Ambulatory Visit | Attending: Gastroenterology | Admitting: Gastroenterology

## 2016-05-22 ENCOUNTER — Encounter (HOSPITAL_COMMUNITY)
Admission: RE | Disposition: A | Discharge status: Home / Self Care | Payer: Self-pay | Source: Ambulatory Visit | Attending: Gastroenterology

## 2016-05-22 DIAGNOSIS — T39395A Adverse effect of other nonsteroidal anti-inflammatory drugs [NSAID], initial encounter: Secondary | ICD-10-CM | POA: Diagnosis not present

## 2016-05-22 DIAGNOSIS — K222 Esophageal obstruction: Secondary | ICD-10-CM | POA: Insufficient documentation

## 2016-05-22 DIAGNOSIS — K625 Hemorrhage of anus and rectum: Secondary | ICD-10-CM

## 2016-05-22 DIAGNOSIS — K2960 Other gastritis without bleeding: Secondary | ICD-10-CM | POA: Insufficient documentation

## 2016-05-22 DIAGNOSIS — R1013 Epigastric pain: Secondary | ICD-10-CM | POA: Insufficient documentation

## 2016-05-22 DIAGNOSIS — F329 Major depressive disorder, single episode, unspecified: Secondary | ICD-10-CM | POA: Insufficient documentation

## 2016-05-22 DIAGNOSIS — Z79899 Other long term (current) drug therapy: Secondary | ICD-10-CM | POA: Insufficient documentation

## 2016-05-22 DIAGNOSIS — Z87891 Personal history of nicotine dependence: Secondary | ICD-10-CM | POA: Insufficient documentation

## 2016-05-22 DIAGNOSIS — K921 Melena: Secondary | ICD-10-CM | POA: Insufficient documentation

## 2016-05-22 DIAGNOSIS — R197 Diarrhea, unspecified: Secondary | ICD-10-CM | POA: Insufficient documentation

## 2016-05-22 DIAGNOSIS — K219 Gastro-esophageal reflux disease without esophagitis: Secondary | ICD-10-CM | POA: Insufficient documentation

## 2016-05-22 DIAGNOSIS — K635 Polyp of colon: Secondary | ICD-10-CM | POA: Insufficient documentation

## 2016-05-22 HISTORY — PX: BIOPSY: SHX5522

## 2016-05-22 HISTORY — DX: Dysplasia of cervix uteri, unspecified: N87.9

## 2016-05-22 HISTORY — DX: Anxiety disorder, unspecified: F41.9

## 2016-05-22 HISTORY — DX: Gastro-esophageal reflux disease without esophagitis: K21.9

## 2016-05-22 HISTORY — PX: ESOPHAGOGASTRODUODENOSCOPY: SHX5428

## 2016-05-22 HISTORY — DX: Personal history of urinary (tract) infections: Z87.440

## 2016-05-22 HISTORY — PX: POLYPECTOMY: SHX5525

## 2016-05-22 HISTORY — DX: Dyspnea, unspecified: R06.00

## 2016-05-22 HISTORY — DX: Unspecified pre-eclampsia, unspecified trimester: O14.90

## 2016-05-22 HISTORY — PX: COLONOSCOPY: SHX5424

## 2016-05-22 SURGERY — COLONOSCOPY
Anesthesia: Moderate Sedation

## 2016-05-22 MED ORDER — SODIUM CHLORIDE 0.9% FLUSH
INTRAVENOUS | Status: AC
  Filled 2016-05-22: qty 10

## 2016-05-22 MED ORDER — PROMETHAZINE HCL 25 MG/ML IJ SOLN
25.0000 mg | Freq: Once | INTRAMUSCULAR | Status: AC
  Administered 2016-05-22: 25 mg via INTRAVENOUS

## 2016-05-22 MED ORDER — MEPERIDINE HCL 100 MG/ML IJ SOLN
INTRAMUSCULAR | Status: AC
  Filled 2016-05-22: qty 2

## 2016-05-22 MED ORDER — SODIUM CHLORIDE 0.9 % IV SOLN
INTRAVENOUS | Status: DC
Start: 2016-05-22 — End: 2016-05-22
  Administered 2016-05-22: 11:00:00 via INTRAVENOUS

## 2016-05-22 MED ORDER — MIDAZOLAM HCL 5 MG/5ML IJ SOLN
INTRAMUSCULAR | Status: DC | PRN
  Administered 2016-05-22 (×4): 2 mg via INTRAVENOUS

## 2016-05-22 MED ORDER — PROMETHAZINE HCL 25 MG/ML IJ SOLN
INTRAMUSCULAR | Status: AC
  Filled 2016-05-22: qty 1

## 2016-05-22 MED ORDER — LIDOCAINE VISCOUS 2 % MT SOLN
OROMUCOSAL | Status: DC | PRN
  Administered 2016-05-22: 1 via OROMUCOSAL

## 2016-05-22 MED ORDER — LIDOCAINE VISCOUS 2 % MT SOLN
OROMUCOSAL | Status: AC
  Filled 2016-05-22: qty 15

## 2016-05-22 MED ORDER — MEPERIDINE HCL 100 MG/ML IJ SOLN
INTRAMUSCULAR | Status: DC | PRN
  Administered 2016-05-22 (×2): 50 mg via INTRAVENOUS

## 2016-05-22 MED ORDER — MIDAZOLAM HCL 5 MG/5ML IJ SOLN
INTRAMUSCULAR | Status: AC
  Filled 2016-05-22: qty 10

## 2016-05-22 NOTE — H&P (Signed)
Primary Care Physician:  Orbie HurstErskine, Kelly B, NP Primary Gastroenterologist:  Dr. Darrick PennaFields  Pre-Procedure History & Physical: HPI:  Kelli Calhoun is a 36 y.o. female here for ABDOMINAL  PAIN/DIARRHEA/RECTAL BLEEDING/Dyspepsia.  Past Medical History:  Diagnosis Date  . Anxiety   . Cervical dysplasia    as a teenager  . Depression   . Dyspnea   . GERD (gastroesophageal reflux disease)   . History of frequent urinary tract infections   . Pre-eclampsia     Past Surgical History:  Procedure Laterality Date  . APPENDECTOMY    . FRACTURE SURGERY     wrist  . MOLE REMOVAL      Prior to Admission medications   Medication Sig Start Date End Date Taking? Authorizing Provider  fluticasone (FLONASE) 50 MCG/ACT nasal spray Place into both nostrils daily.   Yes [provider]  pantoprazole (PROTONIX) 40 MG tablet Take 1 tablet (40 mg total) by mouth daily before breakfast. 03/27/16  Yes Tiffany KocherLewis, Leslie S, PA-C  polyethylene glycol-electrolytes (TRILYTE) 420 g solution Take 4,000 mLs by mouth as directed. 05/02/16  Yes Aliah Eriksson L, MD  Vortioxetine HBr (TRINTELLIX PO) Take by mouth daily.   Yes [provider]  budesonide-formoterol (SYMBICORT) 80-4.5 MCG/ACT inhaler Inhale 2 puffs into the lungs 2 (two) times daily as needed.    [provider]  buPROPion (WELLBUTRIN XL) 150 MG 24 hr tablet Take 300 mg by mouth daily.     [provider]  fluticasone (FLONASE) 50 MCG/ACT nasal spray 1-2 sprays per nostril daily. Patient taking differently: Place 2 sprays into both nostrils as needed.  04/02/13 03/24/16  Acey LavWood, Allison L, MD    Allergies as of 05/02/2016  . (No Known Allergies)    Family History  Problem Relation Age of Onset  . Cancer Mother        lung cancer, died at 1037  . Hypertension Father        died 8056  . Diabetes Father   . Stroke Father   . Cancer Other   . Alzheimer's disease Other   . Brain cancer Maternal Grandfather   . Leukemia  Maternal Aunt   . Alzheimer's disease Maternal Grandmother   . Colon cancer Neg Hx     Social History   Social History  . Marital status: Single    Spouse name: N/A  . Number of children: N/A  . Years of education: N/A   Occupational History  . Not on file.   Social History Main Topics  . Smoking status: Former Smoker    Packs/day: 1.00    Years: 15.00    Types: Cigarettes    Quit date: 05/22/2008  . Smokeless tobacco: Never Used  . Alcohol use Yes     Comment: socially, once every couple of weeks  . Drug use: No  . Sexual activity: Yes    Birth control/ protection: Other-see comments   Other Topics Concern  . Not on file   Social History Narrative  . No narrative on file    Review of Systems: See HPI, otherwise negative ROS   Physical Exam: BP 122/78   Pulse 76   Temp 98.3 F (36.8 C) (Oral)   Resp 18   Ht 5\' 7"  (1.702 m)   Wt 223 lb (101.2 kg)   LMP 05/12/2016   SpO2 94%   BMI 34.93 kg/m  General:   Alert,  pleasant and cooperative in NAD Head:  Normocephalic and atraumatic. Neck:  Supple; Lungs:  Clear throughout to auscultation.    Heart:  Regular rate and rhythm. Abdomen:  Soft, nontender and nondistended. Normal bowel sounds, without guarding, and without rebound.   Neurologic:  Alert and  oriented x4;  grossly normal neurologically.  Impression/Plan:     ABDOMINAL  PAIN/DIARRHEA/RECTAL BLEEDING/Dyspepsia  PLAN: 1. TCS/EGD TODAY-BIOPSY COLON, STOMACH, & DUODENUM. DISCUSSED PROCEDURE, BENEFITS, & RISKS: < 1% chance of medication reaction, bleeding, or perforation.

## 2016-05-22 NOTE — Discharge Instructions (Signed)
You have REFLUX. YOUR UPPER ENDOSCOPY SHOWED A SCHATZKI'S RING AND EROSIVE GASTRITIS. YOU HAD TWO POLYPS REMOVED. You have internal hemorrhoids, WHICH ARE CAUSING RECTAL BLEEDING. THE LAST PART OF YOUR SMALL BOWEL IS NORMAL. I biopsied your colon.   DRINK WATER TO KEEP YOUR URINE LIGHT YELLOW.  CONTINUE YOUR WEIGHT LOSS EFFORTS. You weighed 205 lbs in 2015. Your body mass index is 34(223 LBS).  LOSE 20 LBS. WHILE I DO NOT WANT TO ALARM YOU, YOUR BODY MASS INDEX IS OVER 30 WHICH MEANS YOU ARE OBESE. OBESITY IS ASSOCIATED WITH AN INCREASE RISK FOR ALL CANCERS, INCLUDING ESOPHAGEAL AND COLON CANCER. IT WILL HELP YOUR REFLUX BE BETTER CONTROLLED AND DECREASE YOUR RISK FOR COLON CANCER.  FOLLOW A HIGH FIBER/LOW FAT DIET. AVOID ITEMS THAT CAUSE BLOATING. MEATS SHOULD BE BAKED, BROILED, OR BOILED. AVOID FRIED FOODS.  AVOID REFLUX TRIGGERS. SEE INFO BELOW.  CONTINUE PROTONIX. TAKE 30 MINUTES PRIOR TO BREAKFAST.  YOUR BIOPSY RESULTS WILL BE AVAILABLE IN MY CHART AFTER MAY 17 AND MY OFFICE WILL CONTACT YOU IN 10-14 DAYS WITH YOUR RESULTS.   RETURN FOR EGD WITH PROPOFOL within THE NEXT MONTH. YOU WERE AGITATED AND PULLED THE SCOPE OUT IN SPITE OF ADEQUATE SEDATION.  FOLLOW UP IN 4 MOS.  NEXT COLONOSCOPY IN 5-10 YEARS.   ENDOSCOPY Care After Read the instructions outlined below and refer to this sheet in the next week. These discharge instructions provide you with general information on caring for yourself after you leave the hospital. While your treatment has been planned according to the most current medical practices available, unavoidable complications occasionally occur. If you have any problems or questions after discharge, call DR. Lucile Hillmann, [phone].  ACTIVITY  You may resume your regular activity, but move at a slower pace for the next 24 hours.   Take frequent rest periods for the next 24 hours.   Walking will help get rid of the air and reduce the bloated feeling in your belly  (abdomen).   No driving for 24 hours (because of the medicine (anesthesia) used during the test).   You may shower.   Do not sign any important legal documents or operate any machinery for 24 hours (because of the anesthesia used during the test).    NUTRITION  Drink plenty of fluids.   You may resume your normal diet as instructed by your doctor.   Begin with a light meal and progress to your normal diet. Heavy or fried foods are harder to digest and may make you feel sick to your stomach (nauseated).   Avoid alcoholic beverages for 24 hours or as instructed.    MEDICATIONS  You may resume your normal medications.   WHAT YOU CAN EXPECT TODAY  Some feelings of bloating in the abdomen.   Passage of more gas than usual.   Spotting of blood in your stool or on the toilet paper  .  IF YOU HAD POLYPS REMOVED DURING THE ENDOSCOPY:  Eat a soft diet IF YOU HAVE NAUSEA, BLOATING, ABDOMINAL PAIN, OR VOMITING.    FINDING OUT THE RESULTS OF YOUR TEST Not all test results are available during your visit. DR. Darrick Penna WILL CALL YOU WITHIN 14 DAYS OF YOUR PROCEDUE WITH YOUR RESULTS. Do not assume everything is normal if you have not heard from DR. Homero Hyson, CALL HER OFFICE AT [phone].  SEEK IMMEDIATE MEDICAL ATTENTION AND CALL THE OFFICE: [phone] IF:  You have more than a spotting of blood in your stool.   Your belly  is swollen (abdominal distention).   You are nauseated or vomiting.   You have a temperature over 101F.   You have abdominal pain or discomfort that is severe or gets worse throughout the day.  Polyps, Colon  A polyp is extra tissue that grows inside your body. Colon polyps grow in the large intestine. The large intestine, also called the colon, is part of your digestive system. It is a long, hollow tube at the end of your digestive tract where your body makes and stores stool. Most polyps are not dangerous. They are benign. This means they are not  cancerous. But over time, some types of polyps can turn into cancer. Polyps that are smaller than a pea are usually not harmful. But larger polyps could someday become or may already be cancerous. To be safe, doctors remove all polyps and test them.   WHO GETS POLYPS? Anyone can get polyps, but certain people are more likely than others. You may have a greater chance of getting polyps if:  You are over 50.   You have had polyps before.   Someone in your family has had polyps.   Someone in your family has had cancer of the large intestine.   Find out if someone in your family has had polyps. You may also be more likely to get polyps if you:   Eat a lot of fatty foods   Smoke   Drink alcohol   Do not exercise  Eat too much    PREVENTION There is not one sure way to prevent polyps. You might be able to lower your risk of getting them if you:  Eat more fruits and vegetables and less fatty food.   Do not smoke.   Avoid alcohol.   Exercise every day.   Lose weight if you are overweight.   Eating more calcium and folate can also lower your risk of getting polyps. Some foods that are rich in calcium are milk, cheese, and broccoli. Some foods that are rich in folate are chickpeas, kidney beans, and spinach.   Gastritis  Gastritis is an inflammation (the body's way of reacting to injury and/or infection) of the stomach. DUODENITIS is an inflammation (the body's way of reacting to injury and/or infection) of the FIRST PART OF THE SMALL INTESTINES. It is often caused by bacterial (germ) infections. It can also be caused BY ASPIRIN, BC/GOODY POWDER'S, (IBUPROFEN) MOTRIN, OR ALEVE (NAPROXEN), chemicals (including alcohol), SPICY FOODS, and medications. This illness may be associated with generalized malaise (feeling tired, not well), UPPER ABDOMINAL STOMACH cramps, and fever. One common bacterial cause of gastritis is an organism known as H. Pylori. This can be treated with antibiotics.      Lifestyle and home remedies TO CONTROL REFLUX  You may eliminate or reduce the frequency of heartburn by making the following lifestyle changes:   Control your weight. Being overweight is a major risk factor for heartburn and GERD. Excess pounds put pressure on your abdomen, pushing up your stomach and causing acid to back up into your esophagus.    Eat smaller meals. 4 TO 6 MEALS A DAY. This reduces pressure on the lower esophageal sphincter, helping to prevent the valve from opening and acid from washing back into your esophagus.    Loosen your belt. Clothes that fit tightly around your waist put pressure on your abdomen and the lower esophageal sphincter.    Eliminate heartburn triggers. Everyone has specific triggers. Common triggers such as fatty or fried  foods, spicy food, tomato sauce, carbonated beverages, alcohol, chocolate, mint, garlic, onion, caffeine and nicotine may make heartburn worse.    Avoid stooping or bending. Tying your shoes is OK. Bending over for longer periods to weed your garden isn't, especially soon after eating.    Don't lie down after a meal. Wait at least three to four hours after eating before going to bed, and don't lie down right after eating.    Put the head of the bed on 6 inch blocks or sleep on a wedge with your head above your heart.    Alternative medicine  Several home remedies exist for treating GERD, but they provide only temporary relief. They include drinking baking soda (sodium bicarbonate) added to water or drinking other fluids such as baking soda mixed with cream of tartar and water.   Although these liquids create temporary relief by neutralizing, washing away or buffering acids, eventually they aggravate the situation by adding gas and fluid to your stomach, increasing pressure and causing more acid reflux. Further, adding more sodium to your diet may increase your blood pressure and add stress to your heart, and excessive  bicarbonate ingestion can alter the acid-base balance in your body.

## 2016-05-22 NOTE — Op Note (Signed)
[facility] Patient Name: Kelli Calhoun Procedure Date: 05/22/2016 12:03 PM MRN: [id number] Date of Birth: [birth year] Attending MD: Jonette Eva , MD CSN: [id number] Age: 36 Admit Type: Outpatient Procedure:                Colonoscopy WITH COLD FORCEPS POLYPECTOMY/RANDOM                            BIOPSIES Indications:              Generalized abdominal pain, Clinically significant                            diarrhea of unexplained origin, Hematochezia Providers:                Jonette Eva, MD, Nena Polio, RN, Toniann Fail                            RN, RN Referring MD:             Ernestine Conrad MD, MD Medicines:                Midazolam 6 mg IV, Meperidine 100 mg IV,                            Promethazine 25 mg IV Complications:            No immediate complications. Estimated Blood Loss:     Estimated blood loss was minimal. Procedure:                Pre-Anesthesia Assessment:                           - Prior to the procedure, a History and Physical                            was performed, and patient medications and                            allergies were reviewed. The patient's tolerance of                            previous anesthesia was also reviewed. The risks                            and benefits of the procedure and the sedation                            options and risks were discussed with the patient.                            All questions were answered, and informed consent                            was obtained. Prior Anticoagulants: The patient has  taken no previous anticoagulant or antiplatelet                            agents. ASA Grade Assessment: II - A patient with                            mild systemic disease. After reviewing the risks                            and benefits, the patient was deemed in                            satisfactory condition to undergo the procedure.                            After  obtaining informed consent, the colonoscope                            was passed under direct vision. Throughout the                            procedure, the patient's blood pressure, pulse, and                            oxygen saturations were monitored continuously. The                            [id number] ([id number]) scope was introduced through                            the anus and advanced to the 10 cm into the ileum.                            The colonoscopy was somewhat difficult due to a                            tortuous colon and the patient's agitation.                            Successful completion of the procedure was aided by                            COLOWRAP. The patient tolerated the procedure                            fairly well. The quality of the bowel preparation                            was good. The terminal ileum, ileocecal valve,                            appendiceal orifice, and rectum were photographed. Scope In: 12:44:34 PM Scope Out: 1:00:47 PM Scope Withdrawal Time: 0 hours  12 minutes 11 seconds  Total Procedure Duration: 0 hours 16 minutes 13 seconds  Findings:      The terminal ileum appeared normal.      Two sessile polyps were found in the sigmoid colon. The polyps were 2 to       3 mm in size. These polyps were removed with a cold biopsy forceps.       Resection and retrieval were complete.      The recto-sigmoid colon, sigmoid colon and descending colon were       moderately redundant.      External and internal hemorrhoids were found during retroflexion. The       hemorrhoids were moderate. Impression:               - Two 2 to 3 mm polyps in the sigmoid colon,                            removed with a cold biopsy forceps. Resected and                            retrieved.                           - Redundant colon.                           - External HEMORRHOIDS                           - RECTAL BLEEDING DUE TO internal  hemorrhoids.                           - NO SOURCE FOR ABDOMINAL PAIN OR DIARRHEA                            IDENTIFIED. Moderate Sedation:      Moderate (conscious) sedation was administered by the endoscopy nurse       and supervised by the endoscopist. The following parameters were       monitored: oxygen saturation, heart rate, blood pressure, and response       to care. Total physician intraservice time was 37 minutes. Recommendation:           - Repeat colonoscopy in 5-10 years for surveillance.                           - High fiber diet and low fat diet.                           - Continue present medications.                           - Await pathology results. CONSIDER BENTYL IN                            PERI-MENSTRAL PERIOD TO CONTROL SYMPTOMS IF NO  MICROSCOPIC COLITIS.                           - Return to my office in 4 months.                           - Patient has a contact number available for                            emergencies. The signs and symptoms of potential                            delayed complications were discussed with the                            patient. Return to normal activities tomorrow.                            Written discharge instructions were provided to the                            patient. Procedure Code(s):        --- Professional ---                           [id number], Colonoscopy, flexible; with biopsy, single                            or multiple                           99152, Moderate sedation services provided by the                            same physician or other qualified health care                            professional performing the diagnostic or                            therapeutic service that the sedation supports,                            requiring the presence of an independent trained                            observer to assist in the monitoring of the                             patient's level of consciousness and physiological                            status; initial 15 minutes of intraservice time,  patient age 4 years or older                           [phone], Moderate sedation services; each additional                            15 minutes intraservice time Diagnosis Code(s):        --- Professional ---                           K64.8, Other hemorrhoids                           D12.5, Benign neoplasm of sigmoid colon                           R10.84, Generalized abdominal pain                           R19.7, Diarrhea, unspecified                           K92.1, Melena (includes Hematochezia)                           Q43.8, Other specified congenital malformations of                            intestine CPT copyright 2016 [employer]. All rights reserved. The codes documented in this report are preliminary and upon coder review may  be revised to meet current compliance requirements. Jonette EvaSandi Nazifa Trinka, MD Jonette EvaSandi Boen Sterbenz, MD 05/22/2016 1:30:39 PM This report has been signed electronically. Number of Addenda: 0

## 2016-05-22 NOTE — Op Note (Signed)
[facility] Patient Name: Kelli Calhoun Procedure Date: 05/22/2016 1:01 PM MRN: [id number] Date of Birth: 14-Nov-1980 Attending MD: Barney Drain , MD CSN: [id number] Age: 36 Admit Type: Outpatient Procedure:                Upper GI endoscopy, diagnostic Indications:              Dyspepsia, Esophageal reflux symptoms that persist                            despite appropriate therapy.PT HAS SIGNIFICANT                            STRESSORS. GAINED 15 LBS IN 3 YRS. Providers:                Barney Drain, MD, Lurline Del, RN, Charlyne Petrin                            RN, RN Referring MD:             Celedonio Savage MD, MD Medicines:                Midazolam 2 mg IV Complications:            No immediate complications. Estimated Blood Loss:     Estimated blood loss: none. Procedure:                Pre-Anesthesia Assessment:                           - Prior to the procedure, a History and Physical                            was performed, and patient medications and                            allergies were reviewed. The patient's tolerance of                            previous anesthesia was also reviewed. The risks                            and benefits of the procedure and the sedation                            options and risks were discussed with the patient.                            All questions were answered, and informed consent                            was obtained. Prior Anticoagulants: The patient has                            taken no previous anticoagulant or antiplatelet  agents. ASA Grade Assessment: II - A patient with                            mild systemic disease. After reviewing the risks                            and benefits, the patient was deemed in                            satisfactory condition to undergo the procedure.                            After obtaining informed consent, the endoscope was   passed under direct vision. Throughout the                            procedure, the patient's blood pressure, pulse, and                            oxygen saturations were monitored continuously. The                            [id number] ([id number]) scope was introduced through the                            mouth, and advanced to the second part of duodenum.                            The upper GI endoscopy was technically difficult                            and complex due to the patient's combativeness &                            SCOPE WAS WITHDRAWN. NO BIOPSIES OBTAINED. Scope In: 1:08:13 PM Scope Out: 1:11:18 PM Total Procedure Duration: 0 hours 3 minutes 5 seconds  Findings:      A non-obstructing Schatzki ring (acquired) was found at the       gastroesophageal junction.      Multiple localized, small non-bleeding erosions were found in the       gastric antrum. There were no stigmata of recent bleeding.      The examined duodenum was normal. Impression:               - Non-obstructing Schatzki ring.                           - UPPER ABDOMINAL PAIN DUE TO EROSIVE AGSTRITIS/GERD                           - Normal examined duodenum.                           - Moderate Sedation:      Moderate (conscious) sedation was administered by the endoscopy nurse  and supervised by the endoscopist. The following parameters were       monitored: oxygen saturation, heart rate, blood pressure, and response       to care. Total physician intraservice time was 37 minutes. Recommendation:           - High fiber diet and low fat diet. LOSE 20 LBS.                           - Continue present medications. PROTONIX 30 MINS                            PRIRO TO FIRST MEAL.                           - Repeat upper endoscopy in 1 month WITH MAC.                           - Return to my office in 4 months.                           - Patient has a contact number available for                             emergencies. The signs and symptoms of potential                            delayed complications were discussed with the                            patient. Return to normal activities tomorrow.                            Written discharge instructions were provided to the                            patient. Procedure Code(s):        --- Professional ---                           [phone], Esophagogastroduodenoscopy, flexible,                            transoral; diagnostic, including collection of                            specimen(s) by brushing or washing, when performed                            (separate procedure)                           99152, Moderate sedation services provided by the                            same physician or other qualified health care  professional performing the diagnostic or                            therapeutic service that the sedation supports,                            requiring the presence of an independent trained                            observer to assist in the monitoring of the                            patient's level of consciousness and physiological                            status; initial 15 minutes of intraservice time,                            patient age 51 years or older                           [phone], Moderate sedation services; each additional                            15 minutes intraservice time Diagnosis Code(s):        --- Professional ---                           K22.2, Esophageal obstruction                           K31.89, Other diseases of stomach and duodenum                           R10.13, Epigastric pain                           K21.9, Gastro-esophageal reflux disease without                            esophagitis CPT copyright 2016 American Medical Association. All rights reserved. The codes documented in this report are preliminary and upon coder review may  be revised to meet  current compliance requirements. Barney Drain, MD Barney Drain, MD 05/22/2016 1:36:51 PM This report has been signed electronically. Number of Addenda: 0

## 2016-05-24 ENCOUNTER — Telehealth: Payer: Self-pay | Admitting: General Practice

## 2016-05-24 NOTE — Telephone Encounter (Signed)
PLEASE CALL PT. DR. Kyley Laurel REVIEWED HER RECORDS. SHE WAS NEVER SCHEDULED FOR ENDOSCOPY WITH PROPOFOL BUT WE CAN MAKE SURE SHE HAS MAC WITH ALL ENDOSCOPY IN THE FUTURE TO MAKE SURE SHE IS ABLE TO HAVE ENDOSCOPY WITHOUT RECALL. SHE SHOULD COMPLETE THE ENDOSCOPY WITH MAC BECAUSE NO SOURCE FOR HER ABDOMINAL PAIN WAS IDENTIFIED ON THE COLONOSCOPY. NO BIOPSIES WERE OBTAINED ON MON BECAUSE SHE WAS AGITATED, PULLED OUT THE SCOPE, AND THEN WENT BACK TO SLEEP.

## 2016-05-24 NOTE — Telephone Encounter (Signed)
I spoke with the coding department to make sure the procedure is coded as incomplete and that a f/u procedure will be done in a month

## 2016-05-24 NOTE — Telephone Encounter (Signed)
Patient is scheduled to see LL 6/12 at 1130 to schedule repeat egd with bx's

## 2016-05-24 NOTE — Telephone Encounter (Signed)
PLEASE CALL PT. HER COLON BIOPSIES ARE NORMAL. SHE HAD A HYPERPLASTIC POLYP REMOVED. REPEAT EGD W/ MAC IN ONE MO TO OBTAIN BIOPSIES. NO NEED FOR OPV UNLESS SHE WANTS ONE. SHE CAN BE TRIAGED FOR EGD W/ MAC.

## 2016-05-24 NOTE — Telephone Encounter (Signed)
PT wanted to cancel the appt since Dr. Darrick PennaFields said that she does not have to have an OV. She is aware she is on my list to call and schedule her EGD.

## 2016-05-24 NOTE — Telephone Encounter (Signed)
Patient made aware and voiced understanding.

## 2016-05-24 NOTE — Telephone Encounter (Signed)
PT is aware of results.  

## 2016-05-24 NOTE — Telephone Encounter (Signed)
I received a call from [facility] at [facility] stating she spoke with the patient and she stated that the patient is upset after having her procedure, because she thought she was being done with Propofol.

## 2016-05-24 NOTE — Telephone Encounter (Signed)
Patient can be reached at [phone].

## 2016-05-24 NOTE — Telephone Encounter (Signed)
I spoke with the patient and she stated she felt the tube in her throat and could hear the nurses in the room.  She is very upset and crying and feels like she still has a tube in her throat.  She is also concerned with why she has to come back in a month to repeat upper endoscopy, because that will cause her to pay more money.  Please advise?  Routing to Dr. Darrick PennaFields

## 2016-05-25 NOTE — Telephone Encounter (Signed)
OV cancelled °

## 2016-05-26 ENCOUNTER — Encounter (HOSPITAL_COMMUNITY): Payer: Self-pay | Admitting: Gastroenterology

## 2016-06-20 ENCOUNTER — Ambulatory Visit: Payer: PRIVATE HEALTH INSURANCE | Admitting: Gastroenterology

## 2016-06-28 NOTE — Telephone Encounter (Signed)
Letter reminder mailed to pt to call and schedule her EGD.

## 2016-07-18 ENCOUNTER — Encounter: Payer: Self-pay | Admitting: Gastroenterology

## 2016-07-26 ENCOUNTER — Telehealth: Payer: Self-pay

## 2016-07-26 NOTE — Telephone Encounter (Signed)
Gastroenterology Pre-Procedure Review  Request Date: Requesting Physician: Dr. Oneida Alar  REPEAT EGD WITH MAC  PATIENT REVIEW QUESTIONS: The patient responded to the following health history questions as indicated:    1. Diabetes Melitis: NO 2. Joint replacements in the past 12 months: NO 3. Major health problems in the past 3 months: NO 4. Has an artificial valve or MVP: NO 5. Has a defibrillator: NO 6. Has been advised in past to take antibiotics in advance of a procedure like teeth cleaning: NO 7. Family history of colon cancer: NO 8. Alcohol Use: YES, OCCASSIONAL, MAYBE 1-2 BEERS ONCE A WEEK 9. History of sleep apnea: NO  10. History of coronary artery or other vascular stents placed within the last 12 months: NO 11. History of any prior anesthesia complications: NO    MEDICATIONS & ALLERGIES:    Patient reports the following regarding taking any blood thinners:   Plavix? NO Aspirin? NO Coumadin? NO Brilinta? NO Xarelto? NO Eliquis? NO Pradaxa? NO Savaysa? NO Effient? NO  Patient confirms/reports the following medications:  Current Outpatient Prescriptions  Medication Sig Dispense Refill  . budesonide-formoterol (SYMBICORT) 80-4.5 MCG/ACT inhaler Inhale 2 puffs into the lungs 2 (two) times daily as needed.    . fluticasone (FLONASE) 50 MCG/ACT nasal spray Place into both nostrils daily.    . pantoprazole (PROTONIX) 40 MG tablet Take 1 tablet (40 mg total) by mouth daily before breakfast. 30 tablet 5  . Vortioxetine HBr (TRINTELLIX PO) Take by mouth daily.    Marland Kitchen buPROPion (WELLBUTRIN XL) 150 MG 24 hr tablet Take 300 mg by mouth daily.     . fluticasone (FLONASE) 50 MCG/ACT nasal spray 1-2 sprays per nostril daily. (Patient taking differently: Place 2 sprays into both nostrils as needed. ) 16 g 11   No current facility-administered medications for this visit.     Patient confirms/reports the following allergies:  No Known Allergies  No orders of the defined types were  placed in this encounter.   AUTHORIZATION INFORMATION Primary Insurance: [employer],  ID #: [id number],  Group #:    SCHEDULE INFORMATION: Procedure has been scheduled as follows:  Date: , Time:   Location:   This Gastroenterology Pre-Precedure Review Form is being routed to the following provider(s): Barney Drain, MD

## 2016-07-26 NOTE — Telephone Encounter (Signed)
REVIEWED. EGD W/ MAC.

## 2016-07-27 NOTE — Telephone Encounter (Signed)
Tried to call pt, call went straight to VM. LMOVM for her to call office.

## 2016-07-27 NOTE — Telephone Encounter (Signed)
Pt called office and LMOVM. Tried to call her back, no answer, LMOVM for her to call office again.

## 2016-07-31 ENCOUNTER — Other Ambulatory Visit: Payer: Self-pay

## 2016-07-31 DIAGNOSIS — K219 Gastro-esophageal reflux disease without esophagitis: Secondary | ICD-10-CM

## 2016-07-31 DIAGNOSIS — R1013 Epigastric pain: Secondary | ICD-10-CM

## 2016-07-31 NOTE — Telephone Encounter (Signed)
Called pt. EGD w/MAC with SLF scheduled for 08/08/16 at 8:15am. Instructions given on phone and mailed.

## 2016-07-31 NOTE — Telephone Encounter (Signed)
Pt called office and LMOVM that she needs to reschedule EGD d/t her ride can't take her 08/08/16. Called pt back, rescheduled EGD w/MAC to 08/15/16 at 11:30am.

## 2016-08-02 NOTE — Telephone Encounter (Signed)
Called pt and informed her of pre-op appt 08/09/16 at 2:45pm. Letter also mailed with procedure instructions.

## 2016-08-04 ENCOUNTER — Other Ambulatory Visit (HOSPITAL_COMMUNITY): Payer: PRIVATE HEALTH INSURANCE

## 2016-08-07 NOTE — Patient Instructions (Addendum)
Kelli SearsMegan L Calhoun  08/07/2016     @PREFPERIOPPHARMACY @   Your procedure is scheduled on  08/15/2016   [facility] at  1000  A.M.  Call this number if you have problems the morning of surgery:  [phone]   Remember:  Do not eat food or drink liquids after midnight.  Take these medicines the morning of surgery with A SIP OF WATER  protonix, trintellix.   Do not wear jewelry, make-up or nail polish.  Do not wear lotions, powders, or perfumes, or deoderant.  Do not shave 48 hours prior to surgery.  Men may shave face and neck.  Do not bring valuables to the hospital.  [facility] not [facility] any belongings or valuables.  Contacts, dentures or bridgework may not be worn into surgery.  Leave your suitcase in the car.  After surgery it may be brought to your room.  For patients admitted to the hospital, discharge time will be determined by your treatment team.  Patients discharged the day of surgery will not be allowed to drive home.   Name and phone number of your driver:   family Special instructions:  Follow the diet instructions given to you by Dr Evelina DunField's office.  Please read over the following fact sheets that you were given. Anesthesia Post-op Instructions and Care and Recovery After Surgery         Esophagogastroduodenoscopy Esophagogastroduodenoscopy (EGD) is a procedure to examine the lining of the esophagus, stomach, and first part of the small intestine (duodenum). This procedure is done to check for problems such as inflammation, bleeding, ulcers, or growths. During this procedure, a long, flexible, lighted tube with a camera attached (endoscope) is inserted down the throat. Tell a health care provider about:  Any allergies you have.  All medicines you are taking, including vitamins, herbs, eye drops, creams, and over-the-counter medicines.  Any problems you or family members have had with anesthetic medicines.  Any  blood disorders you have.  Any surgeries you have had.  Any medical conditions you have.  Whether you are pregnant or may be pregnant. What are the risks? Generally, this is a safe procedure. However, problems may occur, including:  Infection.  Bleeding.  A tear (perforation) in the esophagus, stomach, or duodenum.  Trouble breathing.  Excessive sweating.  Spasms of the larynx.  A slowed heartbeat.  Low blood pressure.  What happens before the procedure?  Follow instructions from your health care provider about eating or drinking restrictions.  Ask your health care provider about: ? Changing or stopping your regular medicines. This is especially important if you are taking diabetes medicines or blood thinners. ? Taking medicines such as aspirin and ibuprofen. These medicines can thin your blood. Do not take these medicines before your procedure if your health care provider instructs you not to.  Plan to have someone take you home after the procedure.  If you wear dentures, be ready to remove them before the procedure. What happens during the procedure?  To reduce your risk of infection, your health care team will wash or sanitize their hands.  An IV tube will be put in a vein in your hand or arm. You will get medicines and fluids through this tube.  You will be given one or more of the following: ? A medicine to help you relax (sedative). ? A medicine to numb the area (local anesthetic). This medicine may  be sprayed into your throat. It will make you feel more comfortable and keep you from gagging or coughing during the procedure. ? A medicine for pain.  A mouth guard may be placed in your mouth to protect your teeth and to keep you from biting on the endoscope.  You will be asked to lie on your left side.  The endoscope will be lowered down your throat into your esophagus, stomach, and duodenum.  Air will be put into the endoscope. This will help your health  care provider see better.  The lining of your esophagus, stomach, and duodenum will be examined.  Your health care provider may: ? Take a tissue sample so it can be looked at in a lab (biopsy). ? Remove growths. ? Remove objects (foreign bodies) that are stuck. ? Treat any bleeding with medicines or other devices that stop tissue from bleeding. ? Widen (dilate) or stretch narrowed areas of your esophagus and stomach.  The endoscope will be taken out. The procedure may vary among health care providers and hospitals. What happens after the procedure?  Your blood pressure, heart rate, breathing rate, and blood oxygen level will be monitored often until the medicines you were given have worn off.  Do not eat or drink anything until the numbing medicine has worn off and your gag reflex has returned. This information is not intended to replace advice given to you by your health care provider. Make sure you discuss any questions you have with your health care provider. Document Released: 04/28/2004 Document Revised: 06/03/2015 Document Reviewed: 11/19/2014 Elsevier Interactive Patient Education  2018 [employer] Esophagogastroduodenoscopy, Care After Refer to this sheet in the next few weeks. These instructions provide you with information about caring for yourself after your procedure. Your health care provider may also give you more specific instructions. Your treatment has been planned according to current medical practices, but problems sometimes occur. Call your health care provider if you have any problems or questions after your procedure. What can I expect after the procedure? After the procedure, it is common to have:  A sore throat.  Nausea.  Bloating.  Dizziness.  Fatigue.  Follow these instructions at home:  Do not eat or drink anything until the numbing medicine (local anesthetic) has worn off and your gag reflex has returned. You will know that the local anesthetic has  worn off when you can swallow comfortably.  Do not drive for 24 hours if you received a medicine to help you relax (sedative).  If your health care provider took a tissue sample for testing during the procedure, make sure to get your test results. This is your responsibility. Ask your health care provider or the department performing the test when your results will be ready.  Keep all follow-up visits as told by your health care provider. This is important. Contact a health care provider if:  You cannot stop coughing.  You are not urinating.  You are urinating less than usual. Get help right away if:  You have trouble swallowing.  You cannot eat or drink.  You have throat or chest pain that gets worse.  You are dizzy or light-headed.  You faint.  You have nausea or vomiting.  You have chills.  You have a fever.  You have severe abdominal pain.  You have black, tarry, or bloody stools. This information is not intended to replace advice given to you by your health care provider. Make sure you discuss any questions you have with  your health care provider. Document Released: 12/13/2011 Document Revised: 06/03/2015 Document Reviewed: 11/19/2014 Elsevier Interactive Patient Education  2018 [employer] Inc[employer]  Monitored Anesthesia Care Anesthesia is a term that refers to techniques, procedures, and medicines that help a person stay safe and comfortable during a medical procedure. Monitored anesthesia care, or sedation, is one type of anesthesia. Your anesthesia specialist may recommend sedation if you will be having a procedure that does not require you to be unconscious, such as:  Cataract surgery.  A dental procedure.  A biopsy.  A colonoscopy.  During the procedure, you may receive a medicine to help you relax (sedative). There are three levels of sedation:  Mild sedation. At this level, you may feel awake and relaxed. You will be able to follow directions.  Moderate  sedation. At this level, you will be sleepy. You may not remember the procedure.  Deep sedation. At this level, you will be asleep. You will not remember the procedure.  The more medicine you are given, the deeper your level of sedation will be. Depending on how you respond to the procedure, the anesthesia specialist may change your level of sedation or the type of anesthesia to fit your needs. An anesthesia specialist will monitor you closely during the procedure. Let your health care provider know about:  Any allergies you have.  All medicines you are taking, including vitamins, herbs, eye drops, creams, and over-the-counter medicines.  Any use of steroids (by mouth or as a cream).  Any problems you or family members have had with sedatives and anesthetic medicines.  Any blood disorders you have.  Any surgeries you have had.  Any medical conditions you have, such as sleep apnea.  Whether you are pregnant or may be pregnant.  Any use of cigarettes, alcohol, or street drugs. What are the risks? Generally, this is a safe procedure. However, problems may occur, including:  Getting too much medicine (oversedation).  Nausea.  Allergic reaction to medicines.  Trouble breathing. If this happens, a breathing tube may be used to help with breathing. It will be removed when you are awake and breathing on your own.  Heart trouble.  Lung trouble.  Before the procedure Staying hydrated Follow instructions from your health care provider about hydration, which may include:  Up to 2 hours before the procedure - you may continue to drink clear liquids, such as water, clear fruit juice, black coffee, and plain tea.  Eating and drinking restrictions Follow instructions from your health care provider about eating and drinking, which may include:  8 hours before the procedure - stop eating heavy meals or foods such as meat, fried foods, or fatty foods.  6 hours before the procedure -  stop eating light meals or foods, such as toast or cereal.  6 hours before the procedure - stop drinking milk or drinks that contain milk.  2 hours before the procedure - stop drinking clear liquids.  Medicines Ask your health care provider about:  Changing or stopping your regular medicines. This is especially important if you are taking diabetes medicines or blood thinners.  Taking medicines such as aspirin and ibuprofen. These medicines can thin your blood. Do not take these medicines before your procedure if your health care provider instructs you not to.  Tests and exams  You will have a physical exam.  You may have blood tests done to show: ? How well your kidneys and liver are working. ? How well your blood can clot.  General instructions  Plan to have someone take you home from the hospital or clinic.  If you will be going home right after the procedure, plan to have someone with you for 24 hours.  What happens during the procedure?  Your blood pressure, heart rate, breathing, level of pain and overall condition will be monitored.  An IV tube will be inserted into one of your veins.  Your anesthesia specialist will give you medicines as needed to keep you comfortable during the procedure. This may mean changing the level of sedation.  The procedure will be performed. After the procedure  Your blood pressure, heart rate, breathing rate, and blood oxygen level will be monitored until the medicines you were given have worn off.  Do not drive for 24 hours if you received a sedative.  You may: ? Feel sleepy, clumsy, or nauseous. ? Feel forgetful about what happened after the procedure. ? Have a sore throat if you had a breathing tube during the procedure. ? Vomit. This information is not intended to replace advice given to you by your health care provider. Make sure you discuss any questions you have with your health care provider. Document Released: 09/21/2004  Document Revised: 06/04/2015 Document Reviewed: 04/18/2015 Elsevier Interactive Patient Education  2018 [employer] Inc[employer]  Monitored Anesthesia Care, Care After These instructions provide you with information about caring for yourself after your procedure. Your health care provider may also give you more specific instructions. Your treatment has been planned according to current medical practices, but problems sometimes occur. Call your health care provider if you have any problems or questions after your procedure. What can I expect after the procedure? After your procedure, it is common to:  Feel sleepy for several hours.  Feel clumsy and have poor balance for several hours.  Feel forgetful about what happened after the procedure.  Have poor judgment for several hours.  Feel nauseous or vomit.  Have a sore throat if you had a breathing tube during the procedure.  Follow these instructions at home: For at least 24 hours after the procedure:   Do not: ? Participate in activities in which you could fall or become injured. ? Drive. ? Use heavy machinery. ? Drink alcohol. ? Take sleeping pills or medicines that cause drowsiness. ? Make important decisions or sign legal documents. ? Take care of children on your own.  Rest. Eating and drinking  Follow the diet that is recommended by your health care provider.  If you vomit, drink water, juice, or soup when you can drink without vomiting.  Make sure you have little or no nausea before eating solid foods. General instructions  Have a responsible adult stay with you until you are awake and alert.  Take over-the-counter and prescription medicines only as told by your health care provider.  If you smoke, do not smoke without supervision.  Keep all follow-up visits as told by your health care provider. This is important. Contact a health care provider if:  You keep feeling nauseous or you keep vomiting.  You feel  light-headed.  You develop a rash.  You have a fever. Get help right away if:  You have trouble breathing. This information is not intended to replace advice given to you by your health care provider. Make sure you discuss any questions you have with your health care provider. Document Released: 04/18/2015 Document Revised: 08/18/2015 Document Reviewed: 04/18/2015 Elsevier Interactive Patient Education  [employer].

## 2016-08-09 ENCOUNTER — Encounter (HOSPITAL_COMMUNITY): Payer: Self-pay

## 2016-08-09 ENCOUNTER — Encounter (HOSPITAL_COMMUNITY)
Admission: RE | Admit: 2016-08-09 | Discharge: 2016-08-09 | Disposition: A | Discharge status: Home / Self Care | Payer: PRIVATE HEALTH INSURANCE | Source: Ambulatory Visit | Attending: Gastroenterology | Admitting: Gastroenterology

## 2016-08-09 DIAGNOSIS — K2970 Gastritis, unspecified, without bleeding: Secondary | ICD-10-CM

## 2016-08-09 DIAGNOSIS — K219 Gastro-esophageal reflux disease without esophagitis: Secondary | ICD-10-CM | POA: Insufficient documentation

## 2016-08-09 DIAGNOSIS — R1013 Epigastric pain: Secondary | ICD-10-CM | POA: Diagnosis not present

## 2016-08-09 DIAGNOSIS — B9681 Helicobacter pylori [H. pylori] as the cause of diseases classified elsewhere: Secondary | ICD-10-CM

## 2016-08-09 DIAGNOSIS — Z01810 Encounter for preprocedural cardiovascular examination: Secondary | ICD-10-CM | POA: Insufficient documentation

## 2016-08-09 DIAGNOSIS — Z01818 Encounter for other preprocedural examination: Secondary | ICD-10-CM | POA: Insufficient documentation

## 2016-08-09 HISTORY — DX: Anemia, unspecified: D64.9

## 2016-08-09 HISTORY — DX: Helicobacter pylori (H. pylori) as the cause of diseases classified elsewhere: B96.81

## 2016-08-09 LAB — CBC WITH DIFFERENTIAL/PLATELET
BASOS PCT: 1 %
Basophils Absolute: 0 10*3/uL (ref 0.0–0.1)
EOS ABS: 0.1 10*3/uL (ref 0.0–0.7)
EOS PCT: 2 %
HCT: 35.7 % — ABNORMAL LOW (ref 36.0–46.0)
Hemoglobin: 12.1 g/dL (ref 12.0–15.0)
LYMPHS ABS: 2 10*3/uL (ref 0.7–4.0)
Lymphocytes Relative: 31 %
MCH: 29 pg (ref 26.0–34.0)
MCHC: 33.9 g/dL (ref 30.0–36.0)
MCV: 85.6 fL (ref 78.0–100.0)
MONOS PCT: 7 %
Monocytes Absolute: 0.5 10*3/uL (ref 0.1–1.0)
Neutro Abs: 3.9 10*3/uL (ref 1.7–7.7)
Neutrophils Relative %: 59 %
PLATELETS: 241 10*3/uL (ref 150–400)
RBC: 4.17 MIL/uL (ref 3.87–5.11)
RDW: 12.4 % (ref 11.5–15.5)
WBC: 6.5 10*3/uL (ref 4.0–10.5)

## 2016-08-09 LAB — BASIC METABOLIC PANEL
ANION GAP: 8 (ref 5–15)
BUN: 15 mg/dL (ref 6–20)
CALCIUM: 8.9 mg/dL (ref 8.9–10.3)
CO2: 24 mmol/L (ref 22–32)
CREATININE: 0.99 mg/dL (ref 0.44–1.00)
Chloride: 107 mmol/L (ref 101–111)
Glucose, Bld: 98 mg/dL (ref 65–99)
Potassium: 3.9 mmol/L (ref 3.5–5.1)
Sodium: 139 mmol/L (ref 135–145)

## 2016-08-09 LAB — HCG, SERUM, QUALITATIVE: Preg, Serum: NEGATIVE

## 2016-08-09 NOTE — Progress Notes (Signed)
   08/09/16 1444  OBSTRUCTIVE SLEEP APNEA  Have you ever been diagnosed with sleep apnea through a sleep study? No  Do you snore loudly (loud enough to be heard through closed doors)?  1  Do you often feel tired, fatigued, or sleepy during the daytime (such as falling asleep during driving or talking to someone)? 1  Has anyone observed you stop breathing during your sleep? 0  Do you have, or are you being treated for high blood pressure? 0  BMI more than 35 kg/m2? 1  Age > 50 (1-yes) 0  Neck circumference greater than:Female 16 inches or larger, Female 17inches or larger? 1  Female Gender (Yes=1) 0  Obstructive Sleep Apnea Score 4  Score 5 or greater  Results sent to PCP

## 2016-08-15 ENCOUNTER — Ambulatory Visit (HOSPITAL_COMMUNITY)
Admission: RE | Admit: 2016-08-15 | Discharge: 2016-08-15 | Disposition: A | Discharge status: Home / Self Care | Payer: PRIVATE HEALTH INSURANCE | Source: Ambulatory Visit | Attending: Gastroenterology | Admitting: Gastroenterology

## 2016-08-15 ENCOUNTER — Ambulatory Visit (HOSPITAL_COMMUNITY): Payer: PRIVATE HEALTH INSURANCE | Admitting: Anesthesiology

## 2016-08-15 ENCOUNTER — Encounter (HOSPITAL_COMMUNITY): Payer: Self-pay | Admitting: *Deleted

## 2016-08-15 ENCOUNTER — Encounter (HOSPITAL_COMMUNITY)
Admission: RE | Disposition: A | Discharge status: Home / Self Care | Payer: Self-pay | Source: Ambulatory Visit | Attending: Gastroenterology

## 2016-08-15 ENCOUNTER — Ambulatory Visit (HOSPITAL_COMMUNITY): Admit: 2016-08-15 | Payer: PRIVATE HEALTH INSURANCE | Admitting: Gastroenterology

## 2016-08-15 DIAGNOSIS — K2970 Gastritis, unspecified, without bleeding: Secondary | ICD-10-CM | POA: Diagnosis not present

## 2016-08-15 DIAGNOSIS — F329 Major depressive disorder, single episode, unspecified: Secondary | ICD-10-CM | POA: Insufficient documentation

## 2016-08-15 DIAGNOSIS — Z87440 Personal history of urinary (tract) infections: Secondary | ICD-10-CM | POA: Diagnosis not present

## 2016-08-15 DIAGNOSIS — R1013 Epigastric pain: Secondary | ICD-10-CM | POA: Insufficient documentation

## 2016-08-15 DIAGNOSIS — Z87891 Personal history of nicotine dependence: Secondary | ICD-10-CM | POA: Insufficient documentation

## 2016-08-15 DIAGNOSIS — K2980 Duodenitis without bleeding: Secondary | ICD-10-CM | POA: Diagnosis not present

## 2016-08-15 DIAGNOSIS — Z7951 Long term (current) use of inhaled steroids: Secondary | ICD-10-CM | POA: Insufficient documentation

## 2016-08-15 DIAGNOSIS — R197 Diarrhea, unspecified: Secondary | ICD-10-CM | POA: Diagnosis not present

## 2016-08-15 DIAGNOSIS — Z833 Family history of diabetes mellitus: Secondary | ICD-10-CM | POA: Diagnosis not present

## 2016-08-15 DIAGNOSIS — Z823 Family history of stroke: Secondary | ICD-10-CM | POA: Insufficient documentation

## 2016-08-15 DIAGNOSIS — Z791 Long term (current) use of non-steroidal anti-inflammatories (NSAID): Secondary | ICD-10-CM | POA: Diagnosis not present

## 2016-08-15 DIAGNOSIS — Z86010 Personal history of colonic polyps: Secondary | ICD-10-CM | POA: Diagnosis not present

## 2016-08-15 DIAGNOSIS — Z806 Family history of leukemia: Secondary | ICD-10-CM | POA: Insufficient documentation

## 2016-08-15 DIAGNOSIS — K2950 Unspecified chronic gastritis without bleeding: Secondary | ICD-10-CM | POA: Diagnosis not present

## 2016-08-15 DIAGNOSIS — Z79899 Other long term (current) drug therapy: Secondary | ICD-10-CM | POA: Diagnosis not present

## 2016-08-15 DIAGNOSIS — Z6836 Body mass index (BMI) 36.0-36.9, adult: Secondary | ICD-10-CM | POA: Diagnosis not present

## 2016-08-15 DIAGNOSIS — Z8249 Family history of ischemic heart disease and other diseases of the circulatory system: Secondary | ICD-10-CM | POA: Diagnosis not present

## 2016-08-15 DIAGNOSIS — Z820 Family history of epilepsy and other diseases of the nervous system: Secondary | ICD-10-CM | POA: Diagnosis not present

## 2016-08-15 DIAGNOSIS — K219 Gastro-esophageal reflux disease without esophagitis: Secondary | ICD-10-CM | POA: Diagnosis not present

## 2016-08-15 DIAGNOSIS — Z9049 Acquired absence of other specified parts of digestive tract: Secondary | ICD-10-CM | POA: Insufficient documentation

## 2016-08-15 DIAGNOSIS — Z801 Family history of malignant neoplasm of trachea, bronchus and lung: Secondary | ICD-10-CM | POA: Diagnosis not present

## 2016-08-15 DIAGNOSIS — Z9889 Other specified postprocedural states: Secondary | ICD-10-CM | POA: Insufficient documentation

## 2016-08-15 DIAGNOSIS — B9681 Helicobacter pylori [H. pylori] as the cause of diseases classified elsewhere: Secondary | ICD-10-CM | POA: Diagnosis not present

## 2016-08-15 HISTORY — PX: ESOPHAGOGASTRODUODENOSCOPY (EGD) WITH PROPOFOL: SHX5813

## 2016-08-15 HISTORY — DX: Helicobacter pylori (H. pylori) as the cause of diseases classified elsewhere: B96.81

## 2016-08-15 HISTORY — DX: Gastritis, unspecified, without bleeding: K29.70

## 2016-08-15 SURGERY — ESOPHAGOGASTRODUODENOSCOPY (EGD) WITH PROPOFOL
Anesthesia: Moderate Sedation

## 2016-08-15 SURGERY — ESOPHAGOGASTRODUODENOSCOPY (EGD) WITH PROPOFOL
Anesthesia: Monitor Anesthesia Care

## 2016-08-15 MED ORDER — CHLORHEXIDINE GLUCONATE CLOTH 2 % EX PADS: 6.0000 | MEDICATED_PAD | Freq: Once | CUTANEOUS | Status: DC

## 2016-08-15 MED ORDER — PROPOFOL 500 MG/50ML IV EMUL
INTRAVENOUS | Status: DC | PRN
  Administered 2016-08-15: 150 ug/kg/min via INTRAVENOUS
  Administered 2016-08-15: 12:00:00 via INTRAVENOUS

## 2016-08-15 MED ORDER — MIDAZOLAM HCL 2 MG/2ML IJ SOLN
1.0000 mg | INTRAMUSCULAR | Status: AC
  Administered 2016-08-15: 2 mg via INTRAVENOUS
  Filled 2016-08-15: qty 2

## 2016-08-15 MED ORDER — FENTANYL CITRATE (PF) 100 MCG/2ML IJ SOLN
25.0000 ug | Freq: Once | INTRAMUSCULAR | Status: AC
  Administered 2016-08-15: 25 ug via INTRAVENOUS

## 2016-08-15 MED ORDER — LACTATED RINGERS IV SOLN
INTRAVENOUS | Status: DC
  Administered 2016-08-15: 11:00:00 via INTRAVENOUS

## 2016-08-15 MED ORDER — LIDOCAINE VISCOUS 2 % MT SOLN
5.0000 mL | Freq: Once | OROMUCOSAL | Status: AC
  Administered 2016-08-15: 5 mL via OROMUCOSAL

## 2016-08-15 MED ORDER — LIDOCAINE VISCOUS 2 % MT SOLN
OROMUCOSAL | Status: AC
  Filled 2016-08-15: qty 15

## 2016-08-15 MED ORDER — FENTANYL CITRATE (PF) 100 MCG/2ML IJ SOLN
INTRAMUSCULAR | Status: AC
  Filled 2016-08-15: qty 2

## 2016-08-15 NOTE — Transfer of Care (Signed)
Immediate Anesthesia Transfer of Care Note  Patient: Kelli Calhoun  Procedure(s) Performed: Procedure(s): ESOPHAGOGASTRODUODENOSCOPY (EGD) WITH PROPOFOL (N/A)  Patient Location: PACU  Anesthesia Type:MAC  Level of Consciousness: awake, alert , oriented and patient cooperative  Airway & Oxygen Therapy: Patient Spontanous Breathing  Post-op Assessment: Report given to RN and Post -op Vital signs reviewed and stable  Post vital signs: Reviewed and stable  Last Vitals:  Vitals:   08/15/16 1150 08/15/16 1227  BP: 117/76 105/69  Pulse:  81  Resp:  14  Temp:  (P) 36.9 C    Last Pain:  Vitals:   08/15/16 1015  TempSrc: Oral         Complications: No apparent anesthesia complications

## 2016-08-15 NOTE — Anesthesia Postprocedure Evaluation (Signed)
Anesthesia Post Note  Patient: Kelli Calhoun  Procedure(s) Performed: Procedure(s) (LRB): ESOPHAGOGASTRODUODENOSCOPY (EGD) WITH PROPOFOL (N/A)  Patient location during evaluation: PACU Anesthesia Type: MAC Level of consciousness: awake and alert, oriented and patient cooperative Pain management: pain level controlled Vital Signs Assessment: post-procedure vital signs reviewed and stable Respiratory status: spontaneous breathing and respiratory function stable Cardiovascular status: stable Postop Assessment: no signs of nausea or vomiting Anesthetic complications: no     Last Vitals:  Vitals:   08/15/16 1150 08/15/16 1227  BP: 117/76 105/69  Pulse:  81  Resp:  14  Temp:  36.9 C    Last Pain:  Vitals:   08/15/16 1015  TempSrc: Oral                 Phyllis Abelson A

## 2016-08-15 NOTE — Anesthesia Preprocedure Evaluation (Addendum)
Anesthesia Evaluation  Patient identified by MRN, date of birth, ID band Patient awake    Reviewed: Allergy & Precautions, NPO status , Patient's Chart, lab work & pertinent test results  History of Anesthesia Complications (+) history of anesthetic complications (failed conscious sedation in endoscopy suite)  Airway Mallampati: II  TM Distance: >3 FB Neck ROM: Full    Dental  (+) Teeth Intact   Pulmonary shortness of breath and with exertion, former smoker,    breath sounds clear to auscultation       Cardiovascular hypertension, Pt. on medications  Rhythm:Regular Rate:Normal     Neuro/Psych  Headaches, PSYCHIATRIC DISORDERS Anxiety Depression bilat carpal tunnel Sciatica   Neuromuscular disease    GI/Hepatic GERD  Medicated and Controlled,  Endo/Other  Morbid obesity  Renal/GU      Musculoskeletal   Abdominal   Peds  Hematology  (+) anemia ,   Anesthesia Other Findings   Reproductive/Obstetrics                            Anesthesia Physical Anesthesia Plan  ASA: III  Anesthesia Plan: MAC   Post-op Pain Management:    Induction: Intravenous  PONV Risk Score and Plan:   Airway Management Planned: Simple Face Mask  Additional Equipment:   Intra-op Plan:   Post-operative Plan:   Informed Consent: I have reviewed the patients History and Physical, chart, labs and discussed the procedure including the risks, benefits and alternatives for the proposed anesthesia with the patient or authorized representative who has indicated his/her understanding and acceptance.     Plan Discussed with:   Anesthesia Plan Comments:         Anesthesia Quick Evaluation

## 2016-08-15 NOTE — Addendum Note (Signed)
Addendum  created 08/15/16 1244 by Earleen NewportAdams, Amy A, CRNA   Charge Capture section accepted

## 2016-08-15 NOTE — Op Note (Signed)
[facility] Patient Name: Kelli PerchesMegan Calhoun Procedure Date: 08/15/2016 11:44 AM MRN: [id number] Date of Birth: 09/27/1980 Attending MD: Jonette EvaSandi Fields , MD CSN: [id number] Age: [age] Admit Type: Outpatient Procedure:                Upper GI endoscopy WITH COLD FORCEPS BIOPSY Indications:              Dyspepsia, Diarrhea Providers:                Jonette EvaSandi Fields, MD, Kelli MessingLurae B. Patsy LagerAlbert RN, RN, Kelli Ruddleonya                            Kelli Calhoun Referring MD:             Kelli CashKelly B. Arlyn LeakErskine, [place] Medicines:                Propofol per Anesthesia Complications:            No immediate complications. Estimated Blood Loss:     Estimated blood loss was minimal. Procedure:                Pre-Anesthesia Assessment:                           - Prior to the procedure, a History and Physical                            was performed, and patient medications and                            allergies were reviewed. The patient's tolerance of                            previous anesthesia was also reviewed. The risks                            and benefits of the procedure and the sedation                            options and risks were discussed with the patient.                            All questions were answered, and informed consent                            was obtained. Prior Anticoagulants: The patient has                            taken no previous anticoagulant or antiplatelet                            agents. ASA Grade Assessment: II - A patient with                            mild systemic disease. After reviewing the risks  and benefits, the patient was deemed in                            satisfactory condition to undergo the procedure.                            After obtaining informed consent, the endoscope was                            passed under direct vision. Throughout the                            procedure, the patient's blood pressure, pulse, and         oxygen saturations were monitored continuously. The                            EG-[id number] ([id number]) scope was introduced through the                            mouth, and advanced to the second part of duodenum.                            The upper GI endoscopy was somewhat difficult due                            to the patient's agitation. Successful completion                            of the procedure was aided by increasing the dose                            of sedation medication. The patient tolerated the                            procedure fairly well. Scope In: 12:08:48 PM Scope Out: 12:17:15 PM Total Procedure Duration: 0 hours 8 minutes 27 seconds  Findings:      The examined esophagus was normal.      Patchy mild inflammation characterized by congestion (edema), erosions       and erythema was found in the gastric antrum. Biopsies(5) were taken       with a cold forceps for Helicobacter pylori testing.      Patchy mild inflammation characterized by congestion (edema) and       erythema was found in the duodenal bulb. Biopsies(2) for histology were       taken with a cold forceps for evaluation of celiac disease.      The second portion of the duodenum was normal. Biopsies(4) for histology       were taken with a cold forceps for evaluation of celiac disease.      A small hiatal hernia was present. Impression:               - MILD Gastritis AND Duodenitis. Biopsied. Moderate Sedation:      Per Anesthesia Care Recommendation:           - Await  pathology results.                           - High fiber diet and low fat diet. CONTINUE WEIGHT                            LOSS EFFORTS.                           - Return to my office in 4 months.                           - Patient has a contact number available for                            emergencies. The signs and symptoms of potential                            delayed complications were discussed with the                             patient. Return to normal activities tomorrow.                            Written discharge instructions were provided to the                            patient.                           - Continue present medications. Procedure Code(s):        --- Professional ---                           [phone], Esophagogastroduodenoscopy, flexible,                            transoral; with biopsy, single or multiple Diagnosis Code(s):        --- Professional ---                           K29.70, Gastritis, unspecified, without bleeding                           K29.80, Duodenitis without bleeding                           R10.13, Epigastric pain                           R19.7, Diarrhea, unspecified CPT copyright 2016 American Medical Association. All rights reserved. The codes documented in this report are preliminary and upon coder review may  be revised to meet current compliance requirements. Jonette Eva, MD Jonette Eva, MD 08/15/2016 1:11:31 PM This report has been signed electronically. Number of Addenda: 0

## 2016-08-15 NOTE — Discharge Instructions (Signed)
You have gastritis & DUODENITIS due to NAPROXEN. I use biopsied your stomach & DUODENUM.   DRINK WATER TO KEEP YOUR URINE LIGHT YELLOW.  FOLLOW A LOW FAT DIET. SEE INFO BELOW.  CONTINUE PROTONIX. TAKE 30 MINUTES PRIOR TO BREAKFAST.  YOUR BIOPSY RESULTS WILL BE AVAILABLE IN MY CHART AFTER AUG 11 AND MY OFFICE WILL CONTACT YOU IN 10-14 DAYS WITH YOUR RESULTS.   FOLLOW UP IN DEC 2018.  UPPER ENDOSCOPY AFTER CARE Read the instructions outlined below and refer to this sheet in the next week. These discharge instructions provide you with general information on caring for yourself after you leave the hospital. While your treatment has been planned according to the most current medical practices available, unavoidable complications occasionally occur. If you have any problems or questions after discharge, call DR. Ladesha Pacini, [phone].  ACTIVITY  You may resume your regular activity, but move at a slower pace for the next 24 hours.   Take frequent rest periods for the next 24 hours.   Walking will help get rid of the air and reduce the bloated feeling in your belly (abdomen).   No driving for 24 hours (because of the medicine (anesthesia) used during the test).   You may shower.   Do not sign any important legal documents or operate any machinery for 24 hours (because of the anesthesia used during the test).    NUTRITION  Drink plenty of fluids.   You may resume your normal diet as instructed by your doctor.   Begin with a light meal and progress to your normal diet. Heavy or fried foods are harder to digest and may make you feel sick to your stomach (nauseated).   Avoid alcoholic beverages for 24 hours or as instructed.    MEDICATIONS  You may resume your normal medications.   WHAT YOU CAN EXPECT TODAY  Some feelings of bloating in the abdomen.   Passage of more gas than usual.    IF YOU HAD A BIOPSY TAKEN DURING THE UPPER ENDOSCOPY:  Eat a soft diet IF YOU HAVE  NAUSEA, BLOATING, ABDOMINAL PAIN, OR VOMITING.    FINDING OUT THE RESULTS OF YOUR TEST Not all test results are available during your visit. DR. Darrick PennaFIELDS WILL CALL YOU WITHIN 14 DAYS OF YOUR PROCEDUE WITH YOUR RESULTS. Do not assume everything is normal if you have not heard from DR. Aurianna Earlywine, CALL HER OFFICE AT [phone].  SEEK IMMEDIATE MEDICAL ATTENTION AND CALL THE OFFICE: [phone] IF:  You have more than a spotting of blood in your stool.   Your belly is swollen (abdominal distention).   You are nauseated or vomiting.   You have a temperature over 101F.   You have abdominal pain or discomfort that is severe or gets worse throughout the day.   Gastritis/DUODENITIS  Gastritis is an inflammation (the body's way of reacting to injury and/or infection) of the stomach. DUODENITIS is an inflammation (the body's way of reacting to injury and/or infection) of the FIRST PART OF THE SMALL INTESTINES. It is often caused by bacterial (germ) infections. It can also be caused BY ASPIRIN, BC/GOODY POWDER'S, (IBUPROFEN) MOTRIN, OR ALEVE (NAPROXEN), chemicals (including alcohol), SPICY FOODS, and medications. This illness may be associated with generalized malaise (feeling tired, not well), UPPER ABDOMINAL STOMACH cramps, and fever. One common bacterial cause of gastritis is an organism known as H. Pylori. This can be treated with antibiotics.     Low-Fat Diet BREADS, CEREALS, PASTA, RICE, DRIED PEAS, AND BEANS These  products are high in carbohydrates and most are low in fat. Therefore, they can be increased in the diet as substitutes for fatty foods. They too, however, contain calories and should not be eaten in excess. Cereals can be eaten for snacks as well as for breakfast.  Include foods that contain fiber (fruits, vegetables, whole grains, and legumes). Research shows that fiber may lower blood cholesterol levels, especially the water-soluble fiber found in fruits, vegetables, oat products,  and legumes. FRUITS AND VEGETABLES It is good to eat fruits and vegetables. Besides being sources of fiber, both are rich in vitamins and some minerals. They help you get the daily allowances of these nutrients. Fruits and vegetables can be used for snacks and desserts. MEATS Limit lean meat, chicken, [place], and fish to no more than 6 ounces per day. Beef, Pork, and Lamb Use lean cuts of beef, pork, and lamb. Lean cuts include:  Extra-lean ground beef.  Arm roast.  Sirloin tip.  Center-cut ham.  Round steak.  Loin chops.  Rump roast.  Tenderloin.  Trim all fat off the outside of meats before cooking. It is not necessary to severely decrease the intake of red meat, but lean choices should be made. Lean meat is rich in protein and contains a highly absorbable form of iron. Premenopausal women, in particular, should avoid reducing lean red meat because this could increase the risk for low red blood cells (iron-deficiency anemia).  Chicken and [place] These are good sources of protein. The fat of poultry can be reduced by removing the skin and underlying fat layers before cooking. Chicken and [place] can be substituted for lean red meat in the diet. Poultry should not be fried or covered with high-fat sauces. Fish and Shellfish Fish is a good source of protein. Shellfish contain cholesterol, but they usually are low in saturated fatty acids. The preparation of fish is important. Like chicken and [place], they should not be fried or covered with high-fat sauces. EGGS Egg whites contain no fat or cholesterol. They can be eaten often. Try 1 to 2 egg whites instead of whole eggs in recipes or use egg substitutes that do not contain yolk.  MILK AND DAIRY PRODUCTS Use skim or 1% milk instead of 2% or whole milk. Decrease whole milk, natural, and processed cheeses. Use nonfat or low-fat (2%) cottage cheese or low-fat cheeses made from vegetable oils. Choose nonfat or low-fat (1 to 2%) yogurt. Experiment  with evaporated skim milk in recipes that call for heavy cream. Substitute low-fat yogurt or low-fat cottage cheese for sour cream in dips and salad dressings. Have at least 2 servings of low-fat dairy products, such as 2 glasses of skim (or 1%) milk each day to help get your daily calcium intake.  FATS AND OILS Butterfat, lard, and beef fats are high in saturated fat and cholesterol. These should be avoided.Vegetable fats do not contain cholesterol. AVOID coconut oil, palm oil, and palm kernel oil, WHICH are very high in saturated fats. These should be limited. These fats are often used in bakery goods, processed foods, popcorn, oils, and nondairy creamers. Vegetable shortenings and some peanut butters contain hydrogenated oils, which are also saturated fats. Read the labels on these foods and check for saturated vegetable oils.  Desirable liquid vegetable oils are corn oil, cottonseed oil, olive oil, canola oil, safflower oil, soybean oil, and sunflower oil. Peanut oil is not as good, but small amounts are acceptable. Buy a heart-healthy tub margarine that has no partially hydrogenated  oils in the ingredients. AVOID Mayonnaise and salad dressings often are made from unsaturated fats.  OTHER EATING TIPS Snacks  Most sweets should be limited as snacks. They tend to be rich in calories and fats, and their caloric content outweighs their nutritional value. Some good choices in snacks are graham crackers, melba toast, soda crackers, bagels (no egg), English muffins, fruits, and vegetables. These snacks are preferable to snack crackers, [place] fries, and chips. Popcorn should be air-popped or cooked in small amounts of liquid vegetable oil.  Desserts Eat fruit, low-fat yogurt, and fruit ices instead of pastries, cake, and cookies. Sherbet, angel food cake, gelatin dessert, frozen low-fat yogurt, or other frozen products that do not contain saturated fat (pure fruit juice bars, frozen ice pops) are also  acceptable.   COOKING METHODS Choose those methods that use little or no fat. They include: Poaching.  Braising.  Steaming.  Grilling.  Baking.  Stir-frying.  Broiling.  Microwaving.  Foods can be cooked in a nonstick pan without added fat, or use a nonfat cooking spray in regular cookware. Limit fried foods and avoid frying in saturated fat. Add moisture to lean meats by using water, broth, cooking wines, and other nonfat or low-fat sauces along with the cooking methods mentioned above. Soups and stews should be chilled after cooking. The fat that forms on top after a few hours in the refrigerator should be skimmed off. When preparing meals, avoid using excess salt. Salt can contribute to raising blood pressure in some people.  EATING AWAY FROM HOME Order entres, potatoes, and vegetables without sauces or butter. When meat exceeds the size of a deck of cards (3 to 4 ounces), the rest can be taken home for another meal. Choose vegetable or fruit salads and ask for low-calorie salad dressings to be served on the side. Use dressings sparingly. Limit high-fat toppings, such as bacon, crumbled eggs, cheese, sunflower seeds, and olives. Ask for heart-healthy tub margarine instead of butter.

## 2016-08-15 NOTE — H&P (Signed)
Primary Care Physician:  Orbie Hurst, NP Primary Gastroenterologist:  Dr. Darrick Penna  Pre-Procedure History & Physical: HPI:  Kelli Calhoun is a 36 y.o. female here for DYSPEPSIA.  Past Medical History:  Diagnosis Date  . Anemia   . Anxiety   . Cervical dysplasia    as a teenager  . Depression   . Dyspnea   . GERD (gastroesophageal reflux disease)   . History of frequent urinary tract infections   . Pre-eclampsia     Past Surgical History:  Procedure Laterality Date  . APPENDECTOMY    . BIOPSY  05/22/2016   Procedure: BIOPSY;  Surgeon: West Bali, MD;  Location: [facility];  Service: Endoscopy;;  random colon  . COLONOSCOPY N/A 05/22/2016   Procedure: COLONOSCOPY;  Surgeon: West Bali, MD;  Location: [facility];  Service: Endoscopy;  Laterality: N/A;  12:15 pm  . ESOPHAGOGASTRODUODENOSCOPY N/A 05/22/2016   Procedure: ESOPHAGOGASTRODUODENOSCOPY (EGD);  Surgeon: West Bali, MD;  Location: [facility];  Service: Endoscopy;  Laterality: N/A;  . FRACTURE SURGERY Left    wrist  . MOLE REMOVAL     abdomen  . POLYPECTOMY  05/22/2016   Procedure: POLYPECTOMY;  Surgeon: West Bali, MD;  Location: [facility];  Service: Endoscopy;;  sigmoid colon x2    Prior to Admission medications   Medication Sig Start Date End Date Taking? Authorizing Provider  fluticasone (FLONASE) 50 MCG/ACT nasal spray Place 1 spray into both nostrils at bedtime.    Yes [provider]  naproxen sodium (ANAPROX) 220 MG tablet Take 440 mg by mouth 2 (two) times daily as needed (for pain.).   Yes [provider]  pantoprazole (PROTONIX) 40 MG tablet Take 1 tablet (40 mg total) by mouth daily before breakfast. 03/27/16  Yes Tiffany Kocher, PA-C  TRINTELLIX 5 MG TABS Take 5 mg by mouth daily. 07/31/16  Yes [provider]  budesonide-formoterol (SYMBICORT) 160-4.5 MCG/ACT inhaler Inhale 2 puffs into the lungs 2 (two) times daily as needed (for respiratory  distress).    [provider]    Allergies as of 07/31/2016  . (No Known Allergies)    Family History  Problem Relation Age of Onset  . Cancer Mother        lung cancer, died at 27  . Hypertension Father        died 40  . Diabetes Father   . Stroke Father   . Cancer Other   . Alzheimer's disease Other   . Brain cancer Maternal Grandfather   . Leukemia Maternal Aunt   . Alzheimer's disease Maternal Grandmother   . Colon cancer Neg Hx     Social History   Social History  . Marital status: Single    Spouse name: N/A  . Number of children: N/A  . Years of education: N/A   Occupational History  . Not on file.   Social History Main Topics  . Smoking status: Former Smoker    Packs/day: 1.00    Years: 15.00    Types: Cigarettes    Quit date: 05/22/2008  . Smokeless tobacco: Never Used  . Alcohol use Yes     Comment: socially, once every couple of weeks  . Drug use: No  . Sexual activity: Yes    Birth control/ protection: Other-see comments     Comment: partner had vasectomy   Other Topics Concern  . Not on file   Social History Narrative  .  No narrative on file    Review of Systems: See HPI, otherwise negative ROS   Physical Exam: BP 116/72   Pulse 77   Temp 98.6 F (37 C) (Oral)   Resp (!) 24   Ht 5\' 7"  (1.702 m)   Wt 230 lb (104.3 kg)   LMP 07/26/2016   SpO2 96%   BMI 36.02 kg/m  General:   Alert,  pleasant and cooperative in NAD Head:  Normocephalic and atraumatic. Neck:  Supple; Lungs:  Clear throughout to auscultation.    Heart:  Regular rate and rhythm. Abdomen:  Soft, nontender and nondistended. Normal bowel sounds, without guarding, and without rebound.   Neurologic:  Alert and  oriented x4;  grossly normal neurologically.  Impression/Plan:     DYSPEPSIA  PLAN:  EGD TODAY. DISCUSSED PROCEDURE, BENEFITS, & RISKS: < 1% chance of medication reaction, bleeding, OR perforation.

## 2016-08-17 NOTE — Progress Notes (Signed)
Leslie, please review in Dr. Field's absence.

## 2016-08-18 ENCOUNTER — Encounter (HOSPITAL_COMMUNITY): Payer: Self-pay | Admitting: Gastroenterology

## 2016-08-20 NOTE — Progress Notes (Signed)
Non-urgent. Await SLF input.

## 2016-08-21 ENCOUNTER — Telehealth: Payer: Self-pay | Admitting: Gastroenterology

## 2016-08-21 ENCOUNTER — Encounter (HOSPITAL_COMMUNITY): Payer: Self-pay | Admitting: Gastroenterology

## 2016-08-21 MED ORDER — AMOXICILLIN 500 MG PO TABS: ORAL_TABLET | ORAL | 0 refills | Status: DC

## 2016-08-21 MED ORDER — CLARITHROMYCIN 500 MG PO TABS: ORAL_TABLET | ORAL | 0 refills | Status: DC

## 2016-08-21 NOTE — Telephone Encounter (Signed)
PLEASE CALL PT. PLEASE CALL PT. Her stomach Bx showed H. Pylori infection. She needs AMOXICILLIN 500 mg 2 po BID for 10 days and Biaxin 500 mg po bid for 10 days. She needs PROTONIX BID WHILE TAKING ABX. Med side effects include NAUSEA, VOMITING, DIARRHEA, ABDOMINAL pain, and metallic taste.  FOLLOW UP IN DEC 2018 E30 DYSPEPSIA/H PYLORI GASTRITIS.

## 2016-08-22 ENCOUNTER — Telehealth: Payer: Self-pay | Admitting: Gastroenterology

## 2016-08-22 MED ORDER — PANTOPRAZOLE SODIUM 40 MG PO TBEC: 40.0000 mg | DELAYED_RELEASE_TABLET | Freq: Every day | ORAL | 5 refills | Status: DC

## 2016-08-22 MED ORDER — PANTOPRAZOLE SODIUM 40 MG PO TBEC: 40.0000 mg | DELAYED_RELEASE_TABLET | Freq: Two times a day (BID) | ORAL | 3 refills | Status: DC

## 2016-08-22 NOTE — Telephone Encounter (Signed)
Patient would also like her protonix called into [facility] in 

## 2016-08-22 NOTE — Progress Notes (Signed)
See separate result note from Dr. Darrick PennaFields. Pt is aware.

## 2016-08-22 NOTE — Addendum Note (Signed)
Addended by: Tiffany KocherLEWIS, [place] Gamero S on: 08/22/2016 09:49 PM   Modules accepted: Orders

## 2016-08-22 NOTE — Telephone Encounter (Signed)
See other note

## 2016-08-22 NOTE — Telephone Encounter (Signed)
Patient scheduled for appointment.

## 2016-08-22 NOTE — Telephone Encounter (Signed)
[phone] patient called and stated that her prescriptions need to be sent to [facility] [place]

## 2016-08-22 NOTE — Telephone Encounter (Signed)
Accidentally sent rx to [facility]. PLEASE CALL AND CANCEL.  [facility]

## 2016-08-22 NOTE — Telephone Encounter (Signed)
PT is aware.

## 2016-08-22 NOTE — Telephone Encounter (Signed)
Forwarding to refill box.  

## 2016-08-23 NOTE — Telephone Encounter (Signed)
I called the Am[facility]iaxin [facility]. Pt is aware.

## 2016-09-18 ENCOUNTER — Encounter (HOSPITAL_COMMUNITY): Payer: Self-pay | Admitting: Emergency Medicine

## 2016-09-18 ENCOUNTER — Emergency Department (HOSPITAL_COMMUNITY)
Admission: EM | Admit: 2016-09-18 | Discharge: 2016-09-18 | Disposition: A | Discharge status: Home / Self Care | Payer: PRIVATE HEALTH INSURANCE | Attending: Emergency Medicine | Admitting: Emergency Medicine

## 2016-09-18 DIAGNOSIS — Z87891 Personal history of nicotine dependence: Secondary | ICD-10-CM | POA: Insufficient documentation

## 2016-09-18 DIAGNOSIS — M5432 Sciatica, left side: Secondary | ICD-10-CM

## 2016-09-18 DIAGNOSIS — K029 Dental caries, unspecified: Secondary | ICD-10-CM | POA: Diagnosis not present

## 2016-09-18 DIAGNOSIS — K0889 Other specified disorders of teeth and supporting structures: Secondary | ICD-10-CM | POA: Diagnosis present

## 2016-09-18 DIAGNOSIS — Z79899 Other long term (current) drug therapy: Secondary | ICD-10-CM | POA: Diagnosis not present

## 2016-09-18 DIAGNOSIS — M5442 Lumbago with sciatica, left side: Secondary | ICD-10-CM | POA: Insufficient documentation

## 2016-09-18 HISTORY — DX: Dorsalgia, unspecified: M54.9

## 2016-09-18 LAB — CBC WITH DIFFERENTIAL/PLATELET
Basophils Absolute: 0 10*3/uL (ref 0.0–0.1)
Basophils Relative: 1 %
EOS ABS: 0.1 10*3/uL (ref 0.0–0.7)
Eosinophils Relative: 1 %
HEMATOCRIT: 39.8 % (ref 36.0–46.0)
HEMOGLOBIN: 13.3 g/dL (ref 12.0–15.0)
LYMPHS ABS: 1.9 10*3/uL (ref 0.7–4.0)
Lymphocytes Relative: 30 %
MCH: 28.9 pg (ref 26.0–34.0)
MCHC: 33.4 g/dL (ref 30.0–36.0)
MCV: 86.5 fL (ref 78.0–100.0)
MONO ABS: 0.4 10*3/uL (ref 0.1–1.0)
MONOS PCT: 6 %
NEUTROS PCT: 62 %
Neutro Abs: 4 10*3/uL (ref 1.7–7.7)
Platelets: 221 10*3/uL (ref 150–400)
RBC: 4.6 MIL/uL (ref 3.87–5.11)
RDW: 12.5 % (ref 11.5–15.5)
WBC: 6.4 10*3/uL (ref 4.0–10.5)

## 2016-09-18 LAB — BASIC METABOLIC PANEL
Anion gap: 7 (ref 5–15)
BUN: 13 mg/dL (ref 6–20)
CHLORIDE: 105 mmol/L (ref 101–111)
CO2: 24 mmol/L (ref 22–32)
CREATININE: 0.92 mg/dL (ref 0.44–1.00)
Calcium: 8.8 mg/dL — ABNORMAL LOW (ref 8.9–10.3)
GFR calc Af Amer: >60 mL/min (ref >60)
GFR calc non Af Amer: >60 mL/min (ref >60)
Glucose, Bld: 100 mg/dL — ABNORMAL HIGH (ref 65–99)
Potassium: 4.3 mmol/L (ref 3.5–5.1)
SODIUM: 136 mmol/L (ref 135–145)

## 2016-09-18 LAB — POC URINE PREG, ED: Preg Test, Ur: NEGATIVE

## 2016-09-18 NOTE — ED Triage Notes (Signed)
Pt c/o right side dental pain x 1 week. Pt also c/o "sciatica" x 4 days to left lower back. Pt states had dizziness and blurred vision this am when she woke that lasted about 2-3 hrs. NAD.

## 2016-09-18 NOTE — ED Provider Notes (Signed)
[facility] Provider Note   CSN: [id number] Arrival date & time: 09/18/16  1125     History   Chief Complaint Chief Complaint  Patient presents with  . Back Pain  . Dental Pain  . Dizziness    HPI Kelli Calhoun is a 36 y.o. female.  HPI 36 year old female with dental pain, back pain, and lightheadedness today. She states that she has dental fractures in the right upper jaw. She became somewhat dizzy this morning. She describes this as some blurring of vision and feeling lightheadedness. She subsequently investigated what could cause this and found that having an infection that caused to have low blood pressure, sepsis was a possible cause. She was concerned she has an infection from her teeth. She states she also has left-sided sciatica that comes and goes and has flared recently and is not relieved with her home medications of naproxen. She describes numbness, tingling, weakness, loss of bowel or bladder control. Past Medical History:  Diagnosis Date  . Anemia   . Anxiety   . Back pain   . Cervical dysplasia    as a teenager  . Depression   . Dyspnea   . GERD (gastroesophageal reflux disease)   . Helicobacter pylori gastritis 08/2016  . History of frequent urinary tract infections   . Pre-eclampsia     Patient Active Problem List   Diagnosis Date Noted  . Diarrhea   . Gastritis due to nonsteroidal anti-inflammatory drug   . Rectal bleeding 03/24/2016  . LLQ pain 03/24/2016  . GERD (gastroesophageal reflux disease) 03/24/2016  . Abdominal pain, epigastric 03/24/2016  . Constipation 03/24/2016  . Cervicalgia 08/20/2013  . Chronic tension headaches 08/20/2013  . Lumbago 07/29/2013  . Spondylolisthesis at L5-S1 level 07/29/2013  . Neck pain 07/29/2013  . Sciatica 05/29/2013  . CTS (carpal tunnel syndrome) 05/29/2013  . Overweight 12/18/2012  . Restless leg syndrome 12/18/2012  . Carpal tunnel syndrome 12/18/2012  . Heavy periods 12/18/2012    Past  Surgical History:  Procedure Laterality Date  . APPENDECTOMY    . BIOPSY  05/22/2016   Procedure: BIOPSY;  Surgeon: West Bali, MD;  Location: [facility];  Service: Endoscopy;;  random colon  . COLONOSCOPY N/A 05/22/2016   Procedure: COLONOSCOPY;  Surgeon: West Bali, MD;  Location: [facility];  Service: Endoscopy;  Laterality: N/A;  12:15 pm  . ESOPHAGOGASTRODUODENOSCOPY N/A 05/22/2016   Procedure: ESOPHAGOGASTRODUODENOSCOPY (EGD);  Surgeon: West Bali, MD;  Location: [facility];  Service: Endoscopy;  Laterality: N/A;  . ESOPHAGOGASTRODUODENOSCOPY (EGD) WITH PROPOFOL N/A 08/15/2016   Procedure: ESOPHAGOGASTRODUODENOSCOPY (EGD) WITH PROPOFOL;  Surgeon: West Bali, MD;  Location: [facility];  Service: Endoscopy;  Laterality: N/A;  . FRACTURE SURGERY Left    wrist  . MOLE REMOVAL     abdomen  . POLYPECTOMY  05/22/2016   Procedure: POLYPECTOMY;  Surgeon: West Bali, MD;  Location: [facility];  Service: Endoscopy;;  sigmoid colon x2    OB History    Gravida Para Term Preterm AB Living   SAB TAB Ectopic Multiple Live Births                   Home Medications    Prior to Admission medications   Medication Sig Start Date End Date Taking? Authorizing Provider  fluticasone (FLONASE) 50 MCG/ACT nasal spray Place 1 spray into both nostrils at bedtime.  Yes [provider]  naproxen sodium (ANAPROX) 220 MG tablet Take 440 mg by mouth 2 (two) times daily as needed (for pain.).   Yes [provider]  pantoprazole (PROTONIX) 40 MG tablet Take 1 tablet (40 mg total) by mouth 2 (two) times daily before a meal. For one month, then daily thereafter 08/22/16  Yes Tiffany KocherLewis, Leslie S, PA-C  Specialty Vitamins Products (BRAIN) TABS Take 1 tablet by mouth daily.   Yes [provider]  TRINTELLIX 5 MG TABS Take 5 mg by mouth daily. 07/31/16  Yes [provider]    Family History Family History  Problem Relation Age of  Onset  . Cancer Mother        lung cancer, died at 7937  . Hypertension Father        died 9456  . Diabetes Father   . Stroke Father   . Cancer Other   . Alzheimer's disease Other   . Brain cancer Maternal Grandfather   . Leukemia Maternal Aunt   . Alzheimer's disease Maternal Grandmother   . Colon cancer Neg Hx     Social History Social History  Substance Use Topics  . Smoking status: Former Smoker    Packs/day: 1.00    Years: 15.00    Types: Cigarettes    Quit date: 05/22/2008  . Smokeless tobacco: Never Used  . Alcohol use Yes     Comment: socially, once every couple of weeks     Allergies   Levaquin [levofloxacin]   Review of Systems Review of Systems  All other systems reviewed and are negative.    Physical Exam Updated Vital Signs BP (!) 145/82 (BP Location: Left Arm)   Pulse 83   Temp 98.2 F (36.8 C) (Oral)   Resp 18   Ht 1.702 m (5\' 7" )   Wt 104.3 kg (230 lb)   LMP 08/28/2016   SpO2 96%   BMI 36.02 kg/m   Physical Exam  Constitutional: She is oriented to person, place, and time. She appears well-developed and well-nourished.  HENT:  Head: Normocephalic and atraumatic.  Right Ear: External ear normal.  Left Ear: External ear normal.  Nose: Nose normal.  Multiple areas of poor dentition with right upper wisdom tooth with caries no surrounding gum swelling or fluctuance  Eyes: Pupils are equal, round, and reactive to light. Conjunctivae and EOM are normal.  Neck: Normal range of motion. Neck supple.  Cardiovascular: Normal rate, regular rhythm and intact distal pulses.   Pulmonary/Chest: Effort normal and breath sounds normal.  Abdominal: Soft. Bowel sounds are normal.  Musculoskeletal: Normal range of motion.  Neurological: She is alert and oriented to person, place, and time. She displays normal reflexes. No cranial nerve deficit or sensory deficit. She exhibits normal muscle tone. Coordination normal.  Skin: Skin is warm and dry. Capillary  refill takes less than 2 seconds.  Psychiatric: She has a normal mood and affect.  Nursing note and vitals reviewed.    ED Treatments / Results  Labs (all labs ordered are listed, but only abnormal results are displayed) Labs Reviewed  CBC WITH DIFFERENTIAL/PLATELET  BASIC METABOLIC PANEL    EKG  EKG Interpretation None       Radiology No results found.  Procedures Procedures (including critical care time)  Medications Ordered in ED Medications - No data to display   Initial Impression / Assessment and Plan / ED Course  I have reviewed the triage vital signs and the nursing notes.  Pertinent  labs & imaging results that were available during my care of the patient were reviewed by me and considered in my medical decision making (see chart for details).    Patient hemodynamically stable here and no concern for sepsis- discussed s/s with patient. Patient with poor dentition but no s/s denatl infection and given referral to dentist. Back pain/sciatica- referred to ortho for f/u. Re check blood pressure measured by me 124/72.  Final Clinical Impressions(s) / ED Diagnoses   Final diagnoses:  Dental caries  Pain due to dental caries  Sciatica, left side    New Prescriptions New Prescriptions   No medications on file     Margarita Grizzle, MD 09/18/16 1359

## 2016-09-20 ENCOUNTER — Ambulatory Visit: Payer: PRIVATE HEALTH INSURANCE | Admitting: Gastroenterology

## 2016-10-27 ENCOUNTER — Observation Stay (HOSPITAL_COMMUNITY): Payer: PRIVATE HEALTH INSURANCE | Admitting: Anesthesiology

## 2016-10-27 ENCOUNTER — Observation Stay (HOSPITAL_COMMUNITY): Payer: PRIVATE HEALTH INSURANCE

## 2016-10-27 ENCOUNTER — Observation Stay (HOSPITAL_COMMUNITY)
Admission: EM | Admit: 2016-10-27 | Discharge: 2016-10-28 | Disposition: A | Discharge status: Home / Self Care | Payer: PRIVATE HEALTH INSURANCE | Attending: Orthopedic Surgery | Admitting: Orthopedic Surgery

## 2016-10-27 ENCOUNTER — Encounter (HOSPITAL_COMMUNITY): Payer: Self-pay

## 2016-10-27 ENCOUNTER — Encounter (HOSPITAL_COMMUNITY)
Admission: EM | Disposition: A | Discharge status: Home / Self Care | Payer: Self-pay | Source: Home / Self Care | Attending: Emergency Medicine

## 2016-10-27 ENCOUNTER — Emergency Department (HOSPITAL_COMMUNITY): Payer: PRIVATE HEALTH INSURANCE

## 2016-10-27 DIAGNOSIS — W0110XA Fall on same level from slipping, tripping and stumbling with subsequent striking against unspecified object, initial encounter: Secondary | ICD-10-CM | POA: Insufficient documentation

## 2016-10-27 DIAGNOSIS — K219 Gastro-esophageal reflux disease without esophagitis: Secondary | ICD-10-CM | POA: Diagnosis not present

## 2016-10-27 DIAGNOSIS — S82851D Displaced trimalleolar fracture of right lower leg, subsequent encounter for closed fracture with routine healing: Secondary | ICD-10-CM

## 2016-10-27 DIAGNOSIS — S82891A Other fracture of right lower leg, initial encounter for closed fracture: Secondary | ICD-10-CM

## 2016-10-27 DIAGNOSIS — I10 Essential (primary) hypertension: Secondary | ICD-10-CM | POA: Diagnosis not present

## 2016-10-27 DIAGNOSIS — Y9301 Activity, walking, marching and hiking: Secondary | ICD-10-CM | POA: Diagnosis not present

## 2016-10-27 DIAGNOSIS — Z79899 Other long term (current) drug therapy: Secondary | ICD-10-CM | POA: Insufficient documentation

## 2016-10-27 DIAGNOSIS — Z87891 Personal history of nicotine dependence: Secondary | ICD-10-CM | POA: Insufficient documentation

## 2016-10-27 DIAGNOSIS — Z23 Encounter for immunization: Secondary | ICD-10-CM | POA: Insufficient documentation

## 2016-10-27 DIAGNOSIS — F329 Major depressive disorder, single episode, unspecified: Secondary | ICD-10-CM | POA: Insufficient documentation

## 2016-10-27 DIAGNOSIS — S82851A Displaced trimalleolar fracture of right lower leg, initial encounter for closed fracture: Principal | ICD-10-CM | POA: Diagnosis present

## 2016-10-27 DIAGNOSIS — Z881 Allergy status to other antibiotic agents status: Secondary | ICD-10-CM | POA: Insufficient documentation

## 2016-10-27 DIAGNOSIS — S99911A Unspecified injury of right ankle, initial encounter: Secondary | ICD-10-CM | POA: Diagnosis present

## 2016-10-27 HISTORY — PX: ORIF ANKLE FRACTURE: SHX5408

## 2016-10-27 LAB — CBC WITH DIFFERENTIAL/PLATELET
BASOS ABS: 0 10*3/uL (ref 0.0–0.1)
Basophils Relative: 1 %
EOS PCT: 2 %
Eosinophils Absolute: 0.2 10*3/uL (ref 0.0–0.7)
HEMATOCRIT: 37 % (ref 36.0–46.0)
Hemoglobin: 12.1 g/dL (ref 12.0–15.0)
LYMPHS PCT: 39 %
Lymphs Abs: 3.3 10*3/uL (ref 0.7–4.0)
MCH: 28.9 pg (ref 26.0–34.0)
MCHC: 32.7 g/dL (ref 30.0–36.0)
MCV: 88.5 fL (ref 78.0–100.0)
MONO ABS: 0.6 10*3/uL (ref 0.1–1.0)
Monocytes Relative: 7 %
NEUTROS ABS: 4.3 10*3/uL (ref 1.7–7.7)
Neutrophils Relative %: 51 %
PLATELETS: 220 10*3/uL (ref 150–400)
RBC: 4.18 MIL/uL (ref 3.87–5.11)
RDW: 12.6 % (ref 11.5–15.5)
WBC: 8.4 10*3/uL (ref 4.0–10.5)

## 2016-10-27 LAB — BASIC METABOLIC PANEL
ANION GAP: 8 (ref 5–15)
BUN: 17 mg/dL (ref 6–20)
CALCIUM: 8.8 mg/dL — AB (ref 8.9–10.3)
CO2: 26 mmol/L (ref 22–32)
Chloride: 106 mmol/L (ref 101–111)
Creatinine, Ser: 1.1 mg/dL — ABNORMAL HIGH (ref 0.44–1.00)
GFR calc Af Amer: >60 mL/min (ref >60)
GLUCOSE: 153 mg/dL — AB (ref 65–99)
POTASSIUM: 3.7 mmol/L (ref 3.5–5.1)
SODIUM: 140 mmol/L (ref 135–145)

## 2016-10-27 LAB — I-STAT BETA HCG BLOOD, ED (MC, WL, AP ONLY): I-stat hCG, quantitative: <5 m[IU]/mL (ref <5)

## 2016-10-27 SURGERY — OPEN REDUCTION INTERNAL FIXATION (ORIF) ANKLE FRACTURE
Anesthesia: General | Laterality: Right

## 2016-10-27 MED ORDER — SCOPOLAMINE 1 MG/3DAYS TD PT72
1.0000 | MEDICATED_PATCH | TRANSDERMAL | Status: DC
  Administered 2016-10-27: 1.5 mg via TRANSDERMAL
  Filled 2016-10-27 (×2): qty 1

## 2016-10-27 MED ORDER — KETOROLAC TROMETHAMINE 30 MG/ML IJ SOLN
30.0000 mg | Freq: Four times a day (QID) | INTRAMUSCULAR | Status: AC
  Administered 2016-10-27 – 2016-10-28 (×4): 30 mg via INTRAVENOUS
  Filled 2016-10-27 (×3): qty 1

## 2016-10-27 MED ORDER — ONDANSETRON HCL 4 MG/2ML IJ SOLN
4.0000 mg | Freq: Once | INTRAMUSCULAR | Status: AC
  Administered 2016-10-27: 4 mg via INTRAVENOUS

## 2016-10-27 MED ORDER — FLUTICASONE PROPIONATE 50 MCG/ACT NA SUSP
1.0000 | Freq: Every day | NASAL | Status: DC
  Administered 2016-10-27: 1 via NASAL
  Filled 2016-10-27: qty 16

## 2016-10-27 MED ORDER — PROPOFOL 10 MG/ML IV BOLUS
INTRAVENOUS | Status: AC
  Filled 2016-10-27: qty 20

## 2016-10-27 MED ORDER — GLYCOPYRROLATE 0.2 MG/ML IJ SOLN
INTRAMUSCULAR | Status: AC
  Filled 2016-10-27: qty 2

## 2016-10-27 MED ORDER — HYDROMORPHONE HCL 1 MG/ML IJ SOLN
INTRAMUSCULAR | Status: AC
Start: 2016-10-27 — End: ?
  Filled 2016-10-27: qty 1

## 2016-10-27 MED ORDER — MENTHOL 3 MG MT LOZG: 1.0000 | LOZENGE | OROMUCOSAL | Status: DC | PRN

## 2016-10-27 MED ORDER — ONDANSETRON HCL 4 MG/2ML IJ SOLN
INTRAMUSCULAR | Status: AC
  Filled 2016-10-27: qty 2

## 2016-10-27 MED ORDER — PREGABALIN 50 MG PO CAPS
50.0000 mg | ORAL_CAPSULE | Freq: Once | ORAL | Status: AC
  Administered 2016-10-27: 50 mg via ORAL

## 2016-10-27 MED ORDER — HYDROMORPHONE HCL 1 MG/ML IJ SOLN
0.2500 mg | INTRAMUSCULAR | Status: DC | PRN
  Administered 2016-10-27 (×5): 0.5 mg via INTRAVENOUS
  Filled 2016-10-27: qty 1

## 2016-10-27 MED ORDER — MORPHINE SULFATE (PF) 2 MG/ML IV SOLN
2.0000 mg | INTRAVENOUS | Status: DC | PRN
  Administered 2016-10-27: 2 mg via INTRAVENOUS
  Filled 2016-10-27: qty 1

## 2016-10-27 MED ORDER — SUCCINYLCHOLINE CHLORIDE 20 MG/ML IJ SOLN
INTRAMUSCULAR | Status: DC | PRN
Start: 2016-10-27 — End: 2016-10-27
  Administered 2016-10-27: 150 mg via INTRAVENOUS

## 2016-10-27 MED ORDER — LIDOCAINE HCL 1 % IJ SOLN
INTRAMUSCULAR | Status: DC | PRN
  Administered 2016-10-27: 30 mg via INTRADERMAL

## 2016-10-27 MED ORDER — LIDOCAINE HCL (PF) 1 % IJ SOLN
INTRAMUSCULAR | Status: AC
  Filled 2016-10-27: qty 5

## 2016-10-27 MED ORDER — PROMETHAZINE HCL 25 MG/ML IJ SOLN
INTRAMUSCULAR | Status: AC
  Filled 2016-10-27: qty 1

## 2016-10-27 MED ORDER — ROCURONIUM BROMIDE 50 MG/5ML IV SOLN
INTRAVENOUS | Status: AC
  Filled 2016-10-27: qty 1

## 2016-10-27 MED ORDER — ACETAMINOPHEN 500 MG PO TABS
ORAL_TABLET | ORAL | Status: AC
  Filled 2016-10-27: qty 1

## 2016-10-27 MED ORDER — FENTANYL CITRATE (PF) 100 MCG/2ML IJ SOLN
INTRAMUSCULAR | Status: DC | PRN
  Administered 2016-10-27: 100 ug via INTRAVENOUS
  Administered 2016-10-27 (×5): 50 ug via INTRAVENOUS

## 2016-10-27 MED ORDER — MORPHINE SULFATE (PF) 2 MG/ML IV SOLN: 2.0000 mg | INTRAVENOUS | Status: DC | PRN

## 2016-10-27 MED ORDER — ONDANSETRON HCL 4 MG PO TABS: 4.0000 mg | ORAL_TABLET | Freq: Four times a day (QID) | ORAL | Status: DC | PRN

## 2016-10-27 MED ORDER — MIDAZOLAM HCL 2 MG/2ML IJ SOLN
1.0000 mg | INTRAMUSCULAR | Status: DC
  Administered 2016-10-27: 2 mg via INTRAVENOUS

## 2016-10-27 MED ORDER — FENTANYL CITRATE (PF) 100 MCG/2ML IJ SOLN
25.0000 ug | Freq: Once | INTRAMUSCULAR | Status: AC
  Administered 2016-10-27: 25 ug via INTRAVENOUS

## 2016-10-27 MED ORDER — PROMETHAZINE HCL 25 MG/ML IJ SOLN
12.5000 mg | Freq: Once | INTRAMUSCULAR | Status: AC
  Administered 2016-10-27: 12.5 mg via INTRAVENOUS

## 2016-10-27 MED ORDER — METOCLOPRAMIDE HCL 5 MG/ML IJ SOLN: 5.0000 mg | Freq: Three times a day (TID) | INTRAMUSCULAR | Status: DC | PRN

## 2016-10-27 MED ORDER — BRAIN PO TABS: 1.0000 | ORAL_TABLET | Freq: Every day | ORAL | Status: DC

## 2016-10-27 MED ORDER — CELECOXIB 400 MG PO CAPS
400.0000 mg | ORAL_CAPSULE | Freq: Once | ORAL | Status: AC
  Administered 2016-10-27: 400 mg via ORAL

## 2016-10-27 MED ORDER — 0.9 % SODIUM CHLORIDE (POUR BTL) OPTIME
TOPICAL | Status: DC | PRN
  Administered 2016-10-27: 1000 mL

## 2016-10-27 MED ORDER — MIDAZOLAM HCL 5 MG/5ML IJ SOLN
INTRAMUSCULAR | Status: DC | PRN
  Administered 2016-10-27: 2 mg via INTRAVENOUS

## 2016-10-27 MED ORDER — OXYCODONE HCL 5 MG PO TABS
5.0000 mg | ORAL_TABLET | Freq: Once | ORAL | Status: AC
  Administered 2016-10-27: 5 mg via ORAL

## 2016-10-27 MED ORDER — ACETAMINOPHEN 650 MG RE SUPP: 650.0000 mg | Freq: Four times a day (QID) | RECTAL | Status: DC | PRN

## 2016-10-27 MED ORDER — FENTANYL CITRATE (PF) 100 MCG/2ML IJ SOLN
50.0000 ug | Freq: Once | INTRAMUSCULAR | Status: AC
  Administered 2016-10-27: 50 ug via INTRAVENOUS
  Filled 2016-10-27: qty 2

## 2016-10-27 MED ORDER — ACETAMINOPHEN 325 MG PO TABS: 650.0000 mg | ORAL_TABLET | Freq: Four times a day (QID) | ORAL | Status: DC | PRN

## 2016-10-27 MED ORDER — ASPIRIN EC 325 MG PO TBEC
325.0000 mg | DELAYED_RELEASE_TABLET | Freq: Every day | ORAL | Status: DC
  Administered 2016-10-28: 325 mg via ORAL
  Filled 2016-10-27: qty 1

## 2016-10-27 MED ORDER — PHENOL 1.4 % MT LIQD: 1.0000 | OROMUCOSAL | Status: DC | PRN

## 2016-10-27 MED ORDER — SODIUM CHLORIDE 0.9 % IV BOLUS (SEPSIS)
1000.0000 mL | Freq: Once | INTRAVENOUS | Status: AC
  Administered 2016-10-27: 1000 mL via INTRAVENOUS

## 2016-10-27 MED ORDER — CELECOXIB 400 MG PO CAPS
ORAL_CAPSULE | ORAL | Status: AC
  Filled 2016-10-27: qty 1

## 2016-10-27 MED ORDER — METHOCARBAMOL 1000 MG/10ML IJ SOLN
500.0000 mg | Freq: Once | INTRAVENOUS | Status: AC
  Administered 2016-10-27: 500 mg via INTRAVENOUS
  Filled 2016-10-27: qty 5

## 2016-10-27 MED ORDER — VORTIOXETINE HBR 10 MG PO TABS
5.0000 mg | ORAL_TABLET | Freq: Every day | ORAL | Status: DC
  Administered 2016-10-28: 5 mg via ORAL
  Filled 2016-10-27 (×4): qty 1

## 2016-10-27 MED ORDER — FENTANYL CITRATE (PF) 100 MCG/2ML IJ SOLN
100.0000 ug | Freq: Once | INTRAMUSCULAR | Status: AC
  Administered 2016-10-27: 100 ug via INTRAVENOUS
  Filled 2016-10-27: qty 2

## 2016-10-27 MED ORDER — INFLUENZA VAC SPLIT QUAD 0.5 ML IM SUSY
0.5000 mL | PREFILLED_SYRINGE | INTRAMUSCULAR | Status: AC
  Administered 2016-10-28: 0.5 mL via INTRAMUSCULAR
  Filled 2016-10-27: qty 0.5

## 2016-10-27 MED ORDER — MIDAZOLAM HCL 2 MG/2ML IJ SOLN
INTRAMUSCULAR | Status: AC
  Filled 2016-10-27: qty 2

## 2016-10-27 MED ORDER — HYDROMORPHONE HCL 1 MG/ML IJ SOLN
INTRAMUSCULAR | Status: AC
  Filled 2016-10-27: qty 1

## 2016-10-27 MED ORDER — NEOSTIGMINE METHYLSULFATE 10 MG/10ML IV SOLN
INTRAVENOUS | Status: DC | PRN
  Administered 2016-10-27: 1 mg via INTRAVENOUS
  Administered 2016-10-27: 2 mg via INTRAVENOUS

## 2016-10-27 MED ORDER — FUROSEMIDE 20 MG PO TABS
20.0000 mg | ORAL_TABLET | Freq: Every day | ORAL | Status: DC
  Administered 2016-10-27 – 2016-10-28 (×2): 20 mg via ORAL
  Filled 2016-10-27 (×2): qty 1

## 2016-10-27 MED ORDER — CHLORHEXIDINE GLUCONATE 4 % EX LIQD: 60.0000 mL | Freq: Once | CUTANEOUS | Status: DC

## 2016-10-27 MED ORDER — TETANUS-DIPHTH-ACELL PERTUSSIS 5-2.5-18.5 LF-MCG/0.5 IM SUSP
0.5000 mL | Freq: Once | INTRAMUSCULAR | Status: AC
  Administered 2016-10-27: 0.5 mL via INTRAMUSCULAR
  Filled 2016-10-27: qty 0.5

## 2016-10-27 MED ORDER — CEFAZOLIN SODIUM-DEXTROSE 2-4 GM/100ML-% IV SOLN
2.0000 g | INTRAVENOUS | Status: AC
  Administered 2016-10-27: 2 g via INTRAVENOUS
  Filled 2016-10-27: qty 100

## 2016-10-27 MED ORDER — FENTANYL CITRATE (PF) 100 MCG/2ML IJ SOLN
INTRAMUSCULAR | Status: AC
  Filled 2016-10-27: qty 2

## 2016-10-27 MED ORDER — ACETAMINOPHEN 500 MG PO TABS
500.0000 mg | ORAL_TABLET | Freq: Once | ORAL | Status: AC
  Administered 2016-10-27: 500 mg via ORAL

## 2016-10-27 MED ORDER — POVIDONE-IODINE 10 % EX SWAB: 2.0000 "application " | Freq: Once | CUTANEOUS | Status: DC

## 2016-10-27 MED ORDER — HYDROCODONE-ACETAMINOPHEN 5-325 MG PO TABS
1.0000 | ORAL_TABLET | Freq: Four times a day (QID) | ORAL | Status: DC | PRN
  Filled 2016-10-27: qty 1

## 2016-10-27 MED ORDER — FENTANYL CITRATE (PF) 250 MCG/5ML IJ SOLN
INTRAMUSCULAR | Status: AC
  Filled 2016-10-27: qty 5

## 2016-10-27 MED ORDER — BUPIVACAINE-EPINEPHRINE (PF) 0.5% -1:200000 IJ SOLN
INTRAMUSCULAR | Status: AC
  Filled 2016-10-27: qty 60

## 2016-10-27 MED ORDER — CEFAZOLIN SODIUM-DEXTROSE 1-4 GM/50ML-% IV SOLN
1.0000 g | Freq: Once | INTRAVENOUS | Status: AC
  Administered 2016-10-27: 1 g via INTRAVENOUS
  Filled 2016-10-27: qty 50

## 2016-10-27 MED ORDER — GLYCOPYRROLATE 0.2 MG/ML IJ SOLN
INTRAMUSCULAR | Status: DC | PRN
  Administered 2016-10-27: 0.4 mg via INTRAVENOUS

## 2016-10-27 MED ORDER — ROCURONIUM BROMIDE 100 MG/10ML IV SOLN
INTRAVENOUS | Status: DC | PRN
  Administered 2016-10-27: 30 mg via INTRAVENOUS
  Administered 2016-10-27 (×2): 10 mg via INTRAVENOUS

## 2016-10-27 MED ORDER — KETOROLAC TROMETHAMINE 30 MG/ML IJ SOLN
INTRAMUSCULAR | Status: AC
  Filled 2016-10-27: qty 1

## 2016-10-27 MED ORDER — SUCCINYLCHOLINE CHLORIDE 20 MG/ML IJ SOLN
INTRAMUSCULAR | Status: AC
  Filled 2016-10-27: qty 1

## 2016-10-27 MED ORDER — OXYCODONE HCL 5 MG PO TABS
5.0000 mg | ORAL_TABLET | ORAL | Status: DC | PRN
  Administered 2016-10-27: 5 mg via ORAL
  Administered 2016-10-28: 10 mg via ORAL
  Filled 2016-10-27: qty 2
  Filled 2016-10-27: qty 1

## 2016-10-27 MED ORDER — PREGABALIN 50 MG PO CAPS
ORAL_CAPSULE | ORAL | Status: AC
  Filled 2016-10-27: qty 1

## 2016-10-27 MED ORDER — ONDANSETRON HCL 4 MG/2ML IJ SOLN: 4.0000 mg | Freq: Four times a day (QID) | INTRAMUSCULAR | Status: DC | PRN

## 2016-10-27 MED ORDER — LACTATED RINGERS IV SOLN
INTRAVENOUS | Status: DC
  Administered 2016-10-27: 13:00:00 via INTRAVENOUS

## 2016-10-27 MED ORDER — PROPOFOL 10 MG/ML IV BOLUS
INTRAVENOUS | Status: DC | PRN
  Administered 2016-10-27: 140 mg via INTRAVENOUS

## 2016-10-27 MED ORDER — SODIUM CHLORIDE 0.9 % IV SOLN
INTRAVENOUS | Status: AC
  Administered 2016-10-27: 19:00:00 via INTRAVENOUS

## 2016-10-27 MED ORDER — SODIUM CHLORIDE 0.9% FLUSH
INTRAVENOUS | Status: AC
  Filled 2016-10-27: qty 10

## 2016-10-27 MED ORDER — BUPIVACAINE-EPINEPHRINE (PF) 0.5% -1:200000 IJ SOLN
INTRAMUSCULAR | Status: DC | PRN
  Administered 2016-10-27: 60 mL

## 2016-10-27 MED ORDER — CEFAZOLIN SODIUM-DEXTROSE 2-4 GM/100ML-% IV SOLN
2.0000 g | Freq: Four times a day (QID) | INTRAVENOUS | Status: AC
  Administered 2016-10-27 – 2016-10-28 (×2): 2 g via INTRAVENOUS
  Filled 2016-10-27 (×4): qty 100

## 2016-10-27 MED ORDER — HYDROMORPHONE HCL 1 MG/ML IJ SOLN
0.2500 mg | INTRAMUSCULAR | Status: DC | PRN
  Administered 2016-10-27: 0.5 mg via INTRAVENOUS

## 2016-10-27 MED ORDER — METOCLOPRAMIDE HCL 10 MG PO TABS: 5.0000 mg | ORAL_TABLET | Freq: Three times a day (TID) | ORAL | Status: DC | PRN

## 2016-10-27 MED ORDER — OXYCODONE HCL 5 MG PO TABS
ORAL_TABLET | ORAL | Status: AC
  Filled 2016-10-27: qty 1

## 2016-10-27 MED ORDER — PANTOPRAZOLE SODIUM 40 MG PO TBEC
40.0000 mg | DELAYED_RELEASE_TABLET | Freq: Two times a day (BID) | ORAL | Status: DC
  Administered 2016-10-28: 40 mg via ORAL
  Filled 2016-10-27: qty 1

## 2016-10-27 SURGICAL SUPPLY — 59 items
BANDAGE ELASTIC 4 LF NS (GAUZE/BANDAGES/DRESSINGS) ×4 IMPLANT
BANDAGE ESMARK 4X12 BL STRL LF (DISPOSABLE) ×1 IMPLANT
BIT DRILL CANN 2.7 (BIT) ×2
BIT DRILL SRG 2.7XCANN AO CPLG (BIT) IMPLANT
BIT DRL SRG 2.7XCANN AO CPLNG (BIT) ×1
BLADE SURG SZ10 CARB STEEL (BLADE) ×2 IMPLANT
BNDG CMPR 12X4 ELC STRL LF (DISPOSABLE) ×1
BNDG CMPR MED 5X4 ELC HKLP NS (GAUZE/BANDAGES/DRESSINGS) ×2
BNDG COHESIVE 4X5 TAN STRL (GAUZE/BANDAGES/DRESSINGS) ×2 IMPLANT
BNDG ESMARK 4X12 BLUE STRL LF (DISPOSABLE) ×2
CHLORAPREP W/TINT 26ML (MISCELLANEOUS) ×4 IMPLANT
CLOTH BEACON ORANGE TIMEOUT ST (SAFETY) ×2 IMPLANT
COVER LIGHT HANDLE STERIS (MISCELLANEOUS) ×4 IMPLANT
CUFF TOURNIQUET SINGLE 34IN LL (TOURNIQUET CUFF) ×2 IMPLANT
DECANTER SPIKE VIAL GLASS SM (MISCELLANEOUS) ×4 IMPLANT
DRAPE C-ARM FOLDED MOBILE STRL (DRAPES) ×2 IMPLANT
DRAPE PROXIMA HALF (DRAPES) ×2 IMPLANT
DRESSING XEROFORM 5X9 (GAUZE/BANDAGES/DRESSINGS) ×2 IMPLANT
DRILL 2.6X122MM WL AO SHAFT (BIT) ×1 IMPLANT
GAUZE SPONGE 4X4 12PLY STRL (GAUZE/BANDAGES/DRESSINGS) ×2 IMPLANT
GAUZE XEROFORM 5X9 LF (GAUZE/BANDAGES/DRESSINGS) ×3 IMPLANT
GLOVE BIOGEL M 7.0 STRL (GLOVE) ×2 IMPLANT
GLOVE BIOGEL PI IND STRL 7.0 (GLOVE) ×2 IMPLANT
GLOVE BIOGEL PI INDICATOR 7.0 (GLOVE) ×4
GLOVE SKINSENSE NS SZ8.0 LF (GLOVE) ×1
GLOVE SKINSENSE STRL SZ8.0 LF (GLOVE) ×1 IMPLANT
GLOVE SS N UNI LF 8.5 STRL (GLOVE) ×2 IMPLANT
GOWN STRL REUS W/TWL LRG LVL3 (GOWN DISPOSABLE) ×4 IMPLANT
GOWN STRL REUS W/TWL XL LVL3 (GOWN DISPOSABLE) ×2 IMPLANT
INST SET MINOR BONE (KITS) ×2 IMPLANT
K-WIRE 1.4X100 (WIRE) ×4
KIT ROOM TURNOVER APOR (KITS) ×2 IMPLANT
KWIRE 1.4X100 (WIRE) IMPLANT
MANIFOLD NEPTUNE II (INSTRUMENTS) ×2 IMPLANT
NDL HYPO 21X1.5 SAFETY (NEEDLE) ×1 IMPLANT
NEEDLE HYPO 21X1.5 SAFETY (NEEDLE) ×2 IMPLANT
NS IRRIG 1000ML POUR BTL (IV SOLUTION) ×2 IMPLANT
PACK BASIC LIMB (CUSTOM PROCEDURE TRAY) ×2 IMPLANT
PAD ABD 5X9 TENDERSORB (GAUZE/BANDAGES/DRESSINGS) ×4 IMPLANT
PAD ARMBOARD 7.5X6 YLW CONV (MISCELLANEOUS) ×2 IMPLANT
PAD CAST 4YDX4 CTTN HI CHSV (CAST SUPPLIES) ×1 IMPLANT
PADDING CAST COTTON 4X4 STRL (CAST SUPPLIES) ×2
PLATE 8H 108MM (Plate) ×1 IMPLANT
SCREW 44X4.0MM (Screw) ×1 IMPLANT
SCREW 50X4.0MM (Screw) ×3 IMPLANT
SCREW BONE 14MMX3.5MM (Screw) ×2 IMPLANT
SCREW BONE 3.5X16MM (Screw) ×1 IMPLANT
SCREW BONE 3.5X20MM (Screw) ×1 IMPLANT
SCREW BONE NON-LCKING 3.5X12MM (Screw) ×3 IMPLANT
SET BASIN LINEN APH (SET/KITS/TRAYS/PACK) ×2 IMPLANT
SPLINT J IMMOBILIZER 4X20FT (CAST SUPPLIES) IMPLANT
SPLINT J PLASTER J 4INX20Y (CAST SUPPLIES) ×1
SPONGE LAP 18X18 X RAY DECT (DISPOSABLE) ×2 IMPLANT
STAPLER VISISTAT 35W (STAPLE) ×2 IMPLANT
SUT ETHILON 3 0 FSL (SUTURE) ×1 IMPLANT
SUT MON AB 0 CT1 (SUTURE) ×2 IMPLANT
SUT MON AB 2-0 CT1 36 (SUTURE) ×2 IMPLANT
SYR 30ML LL (SYRINGE) ×2 IMPLANT
SYR BULB IRRIGATION 50ML (SYRINGE) ×2 IMPLANT

## 2016-10-27 NOTE — Brief Op Note (Signed)
10/27/2016  4:30 PM  PATIENT:  Kelli Calhoun  36 y.o. female  PRE-OPERATIVE DIAGNOSIS:  right ankle fracture, trimalleolar  POST-OPERATIVE DIAGNOSIS:  right ankle fracture, trimalleolar  PROCEDURE:  Procedure(s): OPEN REDUCTION INTERNAL FIXATION (ORIF) RIGHT ANKLE FRACTURE (Right)   Ankle solutions lateral plate, medial 2 screws, 1 screw front to back   SURGEON:  Surgeon(s) and Role:    Vickki Hearing* Simmie Camerer E, MD - Primary  PHYSICIAN ASSISTANT:   ASSISTANTS: none   ANESTHESIA:   general  EBL:  35 mL   BLOOD ADMINISTERED:none  DRAINS: none   LOCAL MEDICATIONS USED:  MARCAINE     SPECIMEN:  No Specimen  DISPOSITION OF SPECIMEN:  N/A  COUNTS:  YES  TOURNIQUET:   Total Tourniquet Time Documented: Thigh (Right) - 95 minutes Total: Thigh (Right) - 95 minutes   DICTATION: .Reubin Milanragon Dictation   details of surgical procedure  Surgical findings trimalleolar ankle fracture with posterior malleolar fracture involving 25-30% of the joint surface  Kelli Calhoun was identified in the preop area confirmed by date of birth and medical record number. Site marking was used to confirm surgical site chart review was completed  Patient was taken to surgery for general anesthesia. The right lower extremity was prepped and draped with ChloraPrep  Timeout was executed  The limb was exsanguinated with a 4 inch Esmarch. The tourniquet was elevated to 300 mmHg.  A longitudinal incision was made over the lateral malleolus taken down to bone. I made the incision slightly posterior in anticipation of an anterior and medial incision  Full-thickness skin flaps were created and the comminuted fracture was noted. The anterior portion of the fibula was used for the reduction. There was an oblique portion to the fracture which  Align the fracture up. Once the fracture was lined up x-rays confirmed ankle mortise reduction.  The first screw was placed at the oblique portion of the fracture the  second at the acute angle portion of the fracture and then sequential screws were placed. X-rays confirmed reduction of the fracture and the ankle mortise  I then made a curvilinear incision posterior to the medial malleolus. I created full-thickness skin flaps. I irrigated the joint I did not see any cartilage lesion. I placed a pointed reduction clamp and then placed 2 cannulated screws. X-rays confirmed this reduction  I then made a longitudinal incision anteriorly placed a cannulated screw anterior to posterior. I did have to change screw because the first one was too long. I used to countersink to leavew head flush with the bone  Final x-rays confirmed reduction of the fracture and mortise reduction  Always irrigated copiously.Medial and anterior wounds were closed with 2-0 Monocryl and then staples medially3-0 running nylon anteriorly and 0 Monocryl laterally  The patient was placed in a sugar tong splint after sterile dressings were applied  Postop plan is for observation for pain control and physical therapy. no weight bearing for 12 weeks    PLAN OF CARE: Admit for overnight observation  PATIENT DISPOSITION:  PACU - hemodynamically stable.   Delay start of Pharmacological VTE agent (>24hrs) due to surgical blood loss or risk of bleeding: not applicable   [id number]

## 2016-10-27 NOTE — Progress Notes (Signed)
Pharmacy called RN to notify that patient's home med Trintellix may not be in our pharmacy. RN spoke with patient and advised her to get her family to bring the medication from home and give to day shift RN so it can be administered. Patient acknowledged and said she will.

## 2016-10-27 NOTE — ED Notes (Signed)
Informed consent signed by pt. Dr Alto DenverHarrsion reviewed procedure with pt. Verbalized understanding and allowed time for questions

## 2016-10-27 NOTE — H&P (Addendum)
Kelli Calhoun is an 36 y.o. female.   Chief Complaint: RIGHT ANKLE PAIN  HPI: 43 FELL DOWN STAIRS FRACTURED RIGHT ANKLE   C/O SEVERE NON RADIATING RIGHT ANKLE PAIN WITH DEFORMITY AND PAINFUL ROM  DOI 10/27/2016  Past Medical History:  Diagnosis Date  . Anemia   . Anxiety   . Back pain   . Cervical dysplasia    as a teenager  . Depression   . Dyspnea   . GERD (gastroesophageal reflux disease)   . Helicobacter pylori gastritis 08/2016  . History of frequent urinary tract infections   . Pre-eclampsia     Past Surgical History:  Procedure Laterality Date  . APPENDECTOMY    . BIOPSY  05/22/2016   Procedure: BIOPSY;  Surgeon: Danie Binder, MD;  Location: [facility];  Service: Endoscopy;;  random colon  . COLONOSCOPY N/A 05/22/2016   Procedure: COLONOSCOPY;  Surgeon: Danie Binder, MD;  Location[facility];  Service: Endoscopy;  Laterality: N/A;  12:15 pm  . ESOPHAGOGASTRODUODENOSCOPY N/A 05/22/2016   Procedure: ESOPHAGOGASTRODUODENOSCOPY (EGD);  Surgeon: Danie Binder, MD;  Location: [facility];  Service: Endoscopy;  Laterality: N/A;  . ESOPHAGOGASTRODUODENOSCOPY (EGD) WITH PROPOFOL N/A 08/15/2016   Procedure: ESOPHAGOGASTRODUODENOSCOPY (EGD) WITH PROPOFOL;  Surgeon: Danie Binder, MD;  [facility];  Service: Endoscopy;  Laterality: N/A;  . FRACTURE SURGERY Left    wrist  . MOLE REMOVAL     abdomen  . POLYPECTOMY  05/22/2016   Procedure: POLYPECTOMY;  Surgeon: Danie Binder, MD;  Location: [facility];  Service: Endoscopy;;  sigmoid colon x2    Family History  Problem Relation Age of Onset  . Cancer Mother        lung cancer, died at 45  . Hypertension Father        died 51  . Diabetes Father   . Stroke Father   . Cancer Other   . Alzheimer's disease Other   . Brain cancer Maternal Grandfather   . Leukemia Maternal Aunt   . Alzheimer's disease Maternal Grandmother   . Colon cancer Neg Hx    Social History:  reports that she quit smoking  about 8 years ago. Her smoking use included Cigarettes. She has a 15.00 pack-year smoking history. She has never used smokeless tobacco. She reports that she drinks alcohol. She reports that she does not use drugs.  Allergies:  Allergies  Allergen Reactions  . Levaquin [Levofloxacin] Hives    Medications Prior to Admission  Medication Sig Dispense Refill  . fluticasone (FLONASE) 50 MCG/ACT nasal spray Place 1 spray into both nostrils at bedtime.     . furosemide (LASIX) 20 MG tablet Take 20 mg by mouth.    . pantoprazole (PROTONIX) 40 MG tablet Take 1 tablet (40 mg total) by mouth 2 (two) times daily before a meal. For one month, then daily thereafter 60 tablet 3  . scopolamine (TRANSDERM-SCOP) 1 MG/3DAYS Place 1 patch onto the skin every 3 (three) days.    Marland Kitchen Specialty Vitamins Products (BRAIN) TABS Take 1 tablet by mouth daily.    . TRINTELLIX 5 MG TABS Take 5 mg by mouth daily.    . naproxen sodium (ANAPROX) 220 MG tablet Take 440 mg by mouth 2 (two) times daily as needed (for pain.).      Results for orders placed or performed during the hospital encounter of 10/27/16 (from the past 48 hour(s))  Basic metabolic panel  Status: Abnormal   Collection Time: 10/27/16  3:41 AM  Result Value Ref Range   Sodium 140 135 - 145 mmol/L   Potassium 3.7 3.5 - 5.1 mmol/L   Chloride 106 101 - 111 mmol/L   CO2 26 22 - 32 mmol/L   Glucose, Bld 153 (H) 65 - 99 mg/dL   BUN 17 6 - 20 mg/dL   Creatinine, Ser 1.10 (H) 0.44 - 1.00 mg/dL   Calcium 8.8 (L) 8.9 - 10.3 mg/dL   GFR calc non Af Amer >60 >60 mL/min   GFR calc Af Amer >60 >60 mL/min    Comment: (NOTE) The eGFR has been calculated using the CKD EPI equation. This calculation has not been validated in all clinical situations. eGFR's persistently <60 mL/min signify possible Chronic Kidney Disease.    Anion gap 8 5 - 15  CBC with Differential/Platelet     Status: None   Collection Time: 10/27/16  3:41 AM  Result Value Ref Range   WBC  8.4 4.0 - 10.5 K/uL   RBC 4.18 3.87 - 5.11 MIL/uL   Hemoglobin 12.1 12.0 - 15.0 g/dL   HCT 37.0 36.0 - 46.0 %   MCV 88.5 78.0 - 100.0 fL   MCH 28.9 26.0 - 34.0 pg   MCHC 32.7 30.0 - 36.0 g/dL   RDW 12.6 11.5 - 15.5 %   Platelets 220 150 - 400 K/uL   Neutrophils Relative % 51 %   Neutro Abs 4.3 1.7 - 7.7 K/uL   Lymphocytes Relative 39 %   Lymphs Abs 3.3 0.7 - 4.0 K/uL   Monocytes Relative 7 %   Monocytes Absolute 0.6 0.1 - 1.0 K/uL   Eosinophils Relative 2 %   Eosinophils Absolute 0.2 0.0 - 0.7 K/uL   Basophils Relative 1 %   Basophils Absolute 0.0 0.0 - 0.1 K/uL  I-Stat Beta hCG blood, ED ([facility], [facility], [facility] only)     Status: None   Collection Time: 10/27/16  3:49 AM  Result Value Ref Range   I-stat hCG, quantitative <5.0 <5 mIU/mL   Comment 3            Comment:   GEST. AGE      CONC.  (mIU/mL)   <=1 WEEK        5 - 50     2 WEEKS       50 - 500     3 WEEKS       100 - 10,000     4 WEEKS     1,000 - 30,000        FEMALE AND NON-PREGNANT FEMALE:     LESS THAN 5 mIU/mL    Dg Ankle Complete Right  Result Date: 10/27/2016 CLINICAL DATA:  36 y/o  F; fall with right ankle deformity. EXAM: RIGHT ANKLE - COMPLETE 3+ VIEW COMPARISON:  None. FINDINGS: Acute trimalleolar fracture deformity of the ankle joint. Comminuted oblique lower fibular diaphysis fracture, mild laterally displaced medial malleolus fracture, and mildly posterior displaced posterior malleolus fracture extending through the tibial plafond. There is posterolateral subluxation of the talus relative to tibial plafond. IMPRESSION: Acute trimalleolar fracture deformity of the ankle joint with displacement of fracture fragments. Mild posterolateral subluxation of talus relative to the tibial plafond. Electronically Signed   By: Kristine Garbe M.D.   On: 10/27/2016 05:11   Dg Knee Complete 4 Views Left  Result Date: 10/27/2016 CLINICAL DATA:  Trip and fall injury.  Left knee pain EXAM: LEFT KNEE -  COMPLETE 4+ VIEW  COMPARISON:  None. FINDINGS: Left knee appears intact. No evidence of acute fracture or subluxation. Benign-appearing sclerosis in the proximal tibia. No expansile bone lesion or bone destruction. Bone cortex and trabecular architecture appear intact. No significant effusion. No radiopaque soft tissue foreign bodies. IMPRESSION: No acute bony abnormalities. Electronically Signed   By: Lucienne Capers M.D.   On: 10/27/2016 05:11   Dg Knee Complete 4 Views Right  Result Date: 10/27/2016 CLINICAL DATA:  Slip and fall injury.  Right knee pain. EXAM: RIGHT KNEE - COMPLETE 4+ VIEW COMPARISON:  None. FINDINGS: No evidence of fracture, dislocation, or joint effusion. No evidence of arthropathy or other focal bone abnormality. Soft tissues are unremarkable. IMPRESSION: Negative. Electronically Signed   By: Lucienne Capers M.D.   On: 10/27/2016 05:12   Dg Hip Unilat W Or Wo Pelvis 2-3 Views Right  Result Date: 10/27/2016 CLINICAL DATA:  Trip and fall injury.  Right hip pain. EXAM: DG HIP (WITH OR WITHOUT PELVIS) 2-3V RIGHT COMPARISON:  None. FINDINGS: There is no evidence of hip fracture or dislocation. There is no evidence of arthropathy or other focal bone abnormality. IMPRESSION: Negative. Electronically Signed   By: Lucienne Capers M.D.   On: 10/27/2016 05:10    Review of Systems  All other systems reviewed and are negative.   Blood pressure (!) 100/57, pulse 79, temperature 98.1 F (36.7 C), temperature source Oral, resp. rate 12, height _0  (1.702 m), weight 220 lb (99.8 kg), last menstrual period 10/21/2016, SpO2 94 %. Physical Exam  Constitutional: She is oriented to person, place, and time. She appears well-developed and well-nourished. No distress.  HENT:  Head: Normocephalic and atraumatic.  Mouth/Throat: No oropharyngeal exudate.  Eyes: Pupils are equal, round, and reactive to light. Conjunctivae are normal. Right eye exhibits no discharge. Left eye exhibits no discharge. No scleral  icterus.  Neck: Normal range of motion. Neck supple. No JVD present. No tracheal deviation present. No thyromegaly present.  Cardiovascular: Normal rate, regular rhythm and intact distal pulses.   Respiratory: Effort normal. No stridor. No respiratory distress. She has no wheezes. She exhibits no tenderness.  GI: Soft. She exhibits no distension and no mass. There is no guarding.  Lymphadenopathy:    She has no cervical adenopathy.  Neurological: She is alert and oriented to person, place, and time. She has normal reflexes. She exhibits normal muscle tone. Coordination normal.  Upper extrems coordination normal   Upper extrems reflexes normal  Skin: Skin is warm and dry. No rash noted. She is not diaphoretic. No erythema. No pallor.  Upper extremities and left  lower extrem  Psychiatric: She has a normal mood and affect. Her behavior is normal. Judgment and thought content normal.     Gait is abnormal - fracture cant weight bear   UPPER EXTREMITIES NON TENDER NORMAL ROM STABLILITY AND STRENGTH AND ALIGNMENT NORMAL SENSORY EXAM AND NORMAL PULSE AND PERFUSION   LEFT LEG NORMAL ROM STABILITY AND STRENGTH AND ALIGNMENT NEURO VASCUALR EXAM NORMAL SENSATION NAD PULSE AND PERFUSION   RIGHT FOOT NORMAL COLOR TEMP CAP REFILL AND ROM OF HER TOES SHE WAS JUST SPLINTED    Assessment/Plan RIGHT ANKLE TRI MALL FRACTURE CLOSED   REC SURGERY   ORIF RIGHT ANKLE   The procedure has been fully reviewed with the patient; The risks and benefits of surgery have been discussed and explained and understood. Alternative treatment has also been reviewed, questions were encouraged and answered. The postoperative plan is also  been reviewed.  Arther Abbott, MD 10/27/2016, 2:07 PM

## 2016-10-27 NOTE — Anesthesia Preprocedure Evaluation (Signed)
Anesthesia Evaluation  Patient identified by MRN, date of birth, ID band Patient awake    Reviewed: Allergy & Precautions, NPO status , Patient's Chart, lab work & pertinent test results  Airway Mallampati: III  TM Distance: >3 FB Neck ROM: Full    Dental  (+) Teeth Intact   Pulmonary shortness of breath, former smoker,    breath sounds clear to auscultation       Cardiovascular hypertension, Pt. on medications  Rhythm:Regular Rate:Normal     Neuro/Psych  Headaches, PSYCHIATRIC DISORDERS Anxiety Depression    GI/Hepatic GERD  Medicated and Controlled,  Endo/Other    Renal/GU      Musculoskeletal   Abdominal   Peds  Hematology  (+) anemia ,   Anesthesia Other Findings   Reproductive/Obstetrics                             Anesthesia Physical Anesthesia Plan  ASA: II  Anesthesia Plan: General   Post-op Pain Management:    Induction: Intravenous, Rapid sequence and Cricoid pressure planned  PONV Risk Score and Plan:   Airway Management Planned: Oral ETT and Video Laryngoscope Planned  Additional Equipment:   Intra-op Plan:   Post-operative Plan: Extubation in OR  Informed Consent: I have reviewed the patients History and Physical, chart, labs and discussed the procedure including the risks, benefits and alternatives for the proposed anesthesia with the patient or authorized representative who has indicated his/her understanding and acceptance.     Plan Discussed with:   Anesthesia Plan Comments:         Anesthesia Quick Evaluation

## 2016-10-27 NOTE — ED Notes (Signed)
Pt requesting pain meds. Paged Dr Starling MannsHarrision see new order

## 2016-10-27 NOTE — Transfer of Care (Signed)
Immediate Anesthesia Transfer of Care Note  Patient: Kelli Calhoun  Procedure(s) Performed: OPEN REDUCTION INTERNAL FIXATION (ORIF) RIGHT ANKLE FRACTURE (Right )  Patient Location: PACU  Anesthesia Type:General  Level of Consciousness: awake and patient cooperative  Airway & Oxygen Therapy: Patient Spontanous Breathing and Patient connected to face mask oxygen  Post-op Assessment: Report given to RN, Post -op Vital signs reviewed and stable and Patient moving all extremities  Post vital signs: Reviewed and stable  Last Vitals:  Vitals:   10/27/16 1325 10/27/16 1330  BP: 111/63 (!) 100/57  Pulse:    Resp: 12 12  Temp:    SpO2: 92% 94%    Last Pain:  Vitals:   10/27/16 1259  TempSrc: Oral  PainSc: 7       Patients Stated Pain Goal: 0 (10/27/16 0636)  Complications: No apparent anesthesia complications

## 2016-10-27 NOTE — ED Notes (Signed)
Dr harrison at bedside

## 2016-10-27 NOTE — ED Notes (Signed)
Surgery ETA for 1330.

## 2016-10-27 NOTE — Anesthesia Procedure Notes (Signed)
Procedure Name: Intubation Date/Time: 10/27/2016 2:31 PM Performed by: Despina HiddenIDACAVAGE, Eliodoro Gullett J Pre-anesthesia Checklist: Patient identified, Emergency Drugs available, Suction available and Patient being monitored Patient Re-evaluated:Patient Re-evaluated prior to induction Oxygen Delivery Method: Circle system utilized Preoxygenation: Pre-oxygenation with 100% oxygen Induction Type: IV induction, Cricoid Pressure applied and Rapid sequence Ventilation: Mask ventilation without difficulty Grade View: Grade III Tube size: 7.0 mm Number of attempts: 1 Airway Equipment and Method: Video-laryngoscopy and Rigid stylet Placement Confirmation: ETT inserted through vocal cords under direct vision,  positive ETCO2 and breath sounds checked- equal and bilateral Secured at: 22 cm Tube secured with: Tape Dental Injury: Teeth and Oropharynx as per pre-operative assessment  Difficulty Due To: Difficulty was anticipated and Difficult Airway- due to anterior larynx Future Recommendations: Recommend- induction with short-acting agent, and alternative techniques readily available

## 2016-10-27 NOTE — ED Triage Notes (Signed)
Pt tripped in the dark, fell over one step, has defomity and swelling to right ankle, pain to left knee and right hip.

## 2016-10-27 NOTE — ED Provider Notes (Signed)
[facility] EMERGENCY DEPARTMENT Provider Note   CSN: [id number] Arrival date & time: 10/27/16  4098     History   Chief Complaint Chief Complaint  Patient presents with  . Fall    ankle, knee, hip pain    HPI Kelli Calhoun is a 36 y.o. female.  The history is provided by the patient.  Fall  This is a new problem. Episode onset: just prior to arrival. The problem occurs constantly. The problem has been gradually worsening. Pertinent negatives include no chest pain, no abdominal pain, no headaches and no shortness of breath. The symptoms are aggravated by walking. The symptoms are relieved by rest.  patient with h/o anxiety, anemia, presents with right hip, right ankle and left knee pain s/p fall She reports tripped in the dark falling over a step No head injury No LOC No neck or back pain Most of her pain is in the legs, mostly in right ankle   Past Medical History:  Diagnosis Date  . Anemia   . Anxiety   . Back pain   . Cervical dysplasia    as a teenager  . Depression   . Dyspnea   . GERD (gastroesophageal reflux disease)   . Helicobacter pylori gastritis 08/2016  . History of frequent urinary tract infections   . Pre-eclampsia     Patient Active Problem List   Diagnosis Date Noted  . Diarrhea   . Gastritis due to nonsteroidal anti-inflammatory drug   . Rectal bleeding 03/24/2016  . LLQ pain 03/24/2016  . GERD (gastroesophageal reflux disease) 03/24/2016  . Abdominal pain, epigastric 03/24/2016  . Constipation 03/24/2016  . Cervicalgia 08/20/2013  . Chronic tension headaches 08/20/2013  . Lumbago 07/29/2013  . Spondylolisthesis at L5-S1 level 07/29/2013  . Neck pain 07/29/2013  . Sciatica 05/29/2013  . CTS (carpal tunnel syndrome) 05/29/2013  . Overweight 12/18/2012  . Restless leg syndrome 12/18/2012  . Carpal tunnel syndrome 12/18/2012  . Heavy periods 12/18/2012    Past Surgical History:  Procedure Laterality Date  . APPENDECTOMY    .  BIOPSY  05/22/2016   Procedure: BIOPSY;  Surgeon: West Bali, MD;  Location: [facility];  Service: Endoscopy;;  random colon  . COLONOSCOPY N/A 05/22/2016   Procedure: COLONOSCOPY;  Surgeon: West Bali, MD;  Location: [facility];  Service: Endoscopy;  Laterality: N/A;  12:15 pm  . ESOPHAGOGASTRODUODENOSCOPY N/A 05/22/2016   Procedure: ESOPHAGOGASTRODUODENOSCOPY (EGD);  Surgeon: West Bali, MD;  Location: [facility];  Service: Endoscopy;  Laterality: N/A;  . ESOPHAGOGASTRODUODENOSCOPY (EGD) WITH PROPOFOL N/A 08/15/2016   Procedure: ESOPHAGOGASTRODUODENOSCOPY (EGD) WITH PROPOFOL;  Surgeon: West Bali, MD;  Location: [facility];  Service: Endoscopy;  Laterality: N/A;  . FRACTURE SURGERY Left    wrist  . MOLE REMOVAL     abdomen  . POLYPECTOMY  05/22/2016   Procedure: POLYPECTOMY;  Surgeon: West Bali, MD;  Location: [facility];  Service: Endoscopy;;  sigmoid colon x2    OB History    Gravida Para Term Preterm AB Living   3 3 1 2        SAB TAB Ectopic Multiple Live Births                   Home Medications    Prior to Admission medications   Medication Sig Start Date End Date Taking? Authorizing Provider  pantoprazole (PROTONIX) 40 MG tablet Take 1 tablet (40 mg total) by mouth 2 (two)  times daily before a meal. For one month, then daily thereafter 08/22/16  Yes Tiffany KocherLewis, Leslie S, PA-C  TRINTELLIX 5 MG TABS Take 5 mg by mouth daily. 07/31/16  Yes [provider]  fluticasone (FLONASE) 50 MCG/ACT nasal spray Place 1 spray into both nostrils at bedtime.     [provider]  naproxen sodium (ANAPROX) 220 MG tablet Take 440 mg by mouth 2 (two) times daily as needed (for pain.).    [provider]  Specialty Vitamins Products (BRAIN) TABS Take 1 tablet by mouth daily.    [provider]    Family History Family History  Problem Relation Age of Onset  . Cancer Mother        lung cancer, died at 3237  . Hypertension Father         died 2656  . Diabetes Father   . Stroke Father   . Cancer Other   . Alzheimer's disease Other   . Brain cancer Maternal Grandfather   . Leukemia Maternal Aunt   . Alzheimer's disease Maternal Grandmother   . Colon cancer Neg Hx     Social History Social History  Substance Use Topics  . Smoking status: Former Smoker    Packs/day: 1.00    Years: 15.00    Types: Cigarettes    Quit date: 05/22/2008  . Smokeless tobacco: Never Used  . Alcohol use Yes     Comment: socially, once every couple of weeks     Allergies   Levaquin [levofloxacin]   Review of Systems Review of Systems  Constitutional: Negative for fever.  Respiratory: Negative for shortness of breath.   Cardiovascular: Negative for chest pain.  Gastrointestinal: Negative for abdominal pain and vomiting.  Musculoskeletal: Positive for arthralgias. Negative for back pain and neck pain.  Neurological: Negative for headaches.  All other systems reviewed and are negative.    Physical Exam Updated Vital Signs Ht 1.702 m (5\' 7" )   Wt 99.8 kg (220 lb)   LMP 10/21/2016   BMI 34.46 kg/m   Physical Exam  CONSTITUTIONAL: Well developed/well nourished HEAD: Normocephalic/atraumatic EYES: EOMI/PERRL ENMT: Mucous membranes moist NECK: supple no meningeal signs SPINE/BACK:entire spine nontender, No bruising/crepitance/stepoffs noted to spine CV: S1/S2 noted, no murmurs/rubs/gallops noted LUNGS: Lungs are clear to auscultation bilaterally, no apparent distress ABDOMEN: soft, nontender  GU:no cva tenderness NEURO: Pt is awake/alert/appropriate, moves all extremitiesx4.  No facial droop.   EXTREMITIES:  full ROM, obvious deformity to right tib/fib/ankle, skin tenting noted over medial distal tib/fib, small abrasion noted, no active bleeding no lacerations noted.  Right foot is warm to palpation, distal cap refill less than 2 seconds, able to wiggle toes, no sensory deficit noted Tenderness to palpation of left knee.    SKIN: warm, color normal PSYCH: no abnormalities of mood noted, alert and oriented to situation  ED Treatments / Results  Labs (all labs ordered are listed, but only abnormal results are displayed) Labs Reviewed  BASIC METABOLIC PANEL - Abnormal; Notable for the following:       Result Value   Glucose, Bld 153 (*)    Creatinine, Ser 1.10 (*)    Calcium 8.8 (*)    All other components within normal limits  CBC WITH DIFFERENTIAL/PLATELET  I-STAT BETA HCG BLOOD, ED ([facility], [facility], [facility] ONLY)    EKG  EKG Interpretation None       Radiology Dg Ankle Complete Right  Result Date: 10/27/2016 CLINICAL DATA:  36 y/o  F; fall with right  ankle deformity. EXAM: RIGHT ANKLE - COMPLETE 3+ VIEW COMPARISON:  None. FINDINGS: Acute trimalleolar fracture deformity of the ankle joint. Comminuted oblique lower fibular diaphysis fracture, mild laterally displaced medial malleolus fracture, and mildly posterior displaced posterior malleolus fracture extending through the tibial plafond. There is posterolateral subluxation of the talus relative to tibial plafond. IMPRESSION: Acute trimalleolar fracture deformity of the ankle joint with displacement of fracture fragments. Mild posterolateral subluxation of talus relative to the tibial plafond. Electronically Signed   By: Mitzi Hansen M.D.   On: 10/27/2016 05:11   Dg Knee Complete 4 Views Left  Result Date: 10/27/2016 CLINICAL DATA:  Trip and fall injury.  Left knee pain EXAM: LEFT KNEE - COMPLETE 4+ VIEW COMPARISON:  None. FINDINGS: Left knee appears intact. No evidence of acute fracture or subluxation. Benign-appearing sclerosis in the proximal tibia. No expansile bone lesion or bone destruction. Bone cortex and trabecular architecture appear intact. No significant effusion. No radiopaque soft tissue foreign bodies. IMPRESSION: No acute bony abnormalities. Electronically Signed   By: Burman Nieves M.D.   On: 10/27/2016 05:11   Dg Knee Complete 4  Views Right  Result Date: 10/27/2016 CLINICAL DATA:  Slip and fall injury.  Right knee pain. EXAM: RIGHT KNEE - COMPLETE 4+ VIEW COMPARISON:  None. FINDINGS: No evidence of fracture, dislocation, or joint effusion. No evidence of arthropathy or other focal bone abnormality. Soft tissues are unremarkable. IMPRESSION: Negative. Electronically Signed   By: Burman Nieves M.D.   On: 10/27/2016 05:12   Dg Hip Unilat W Or Wo Pelvis 2-3 Views Right  Result Date: 10/27/2016 CLINICAL DATA:  Trip and fall injury.  Right hip pain. EXAM: DG HIP (WITH OR WITHOUT PELVIS) 2-3V RIGHT COMPARISON:  None. FINDINGS: There is no evidence of hip fracture or dislocation. There is no evidence of arthropathy or other focal bone abnormality. IMPRESSION: Negative. Electronically Signed   By: Burman Nieves M.D.   On: 10/27/2016 05:10    Procedures Procedures .  SPLINT APPLICATION Date/Time: 5:34 AM Authorized by: Joya Gaskins Consent: Verbal consent obtained. Risks and benefits: risks, benefits and alternatives were discussed Consent given by: patient Splint applied by: myself and nursing Location details: right lower extremity Splint type: stirrup/posterior short leg Supplies used: ortho glass Post-procedure: The splinted body part was neurovascularly unchanged following the procedure. Patient tolerance: Patient tolerated the procedure well with no immediate complications.    Medications Ordered in ED Medications  sodium chloride 0.9 % bolus 1,000 mL (not administered)  fentaNYL (SUBLIMAZE) injection 50 mcg (not administered)  fentaNYL (SUBLIMAZE) injection 100 mcg (100 mcg Intravenous Given 10/27/16 0402)  Tdap (BOOSTRIX) injection 0.5 mL (0.5 mLs Intramuscular Given 10/27/16 0400)  ceFAZolin (ANCEF) IVPB 1 g/50 mL premix (0 g Intravenous Stopped 10/27/16 0505)  fentaNYL (SUBLIMAZE) injection 50 mcg (50 mcg Intravenous Given 10/27/16 0531)     Initial Impression / Assessment and Plan / ED  Course  I have reviewed the triage vital signs and the nursing notes.  Pertinent labs & imaging results that were available during my care of the patient were reviewed by me and considered in my medical decision making (see chart for details).     4:02 AM Pt with mechanical fall She has obvious deformity to right LE Will need imaging, likely needs operative management 5:33 AM Pt with trimalleolar fx, no signs of open injury, though she did have small abrasion near medial surface of right ankle D/w dr Romeo Apple He will review imaging and call back 6:26  AM Dr Romeo Apple plans to admit and take to OR of OR[facility] Pt updated on plan She agrees with plan   Patient tolerated splint application Cap refill is less than 3 seconds in both feet She is able to wiggle toes in right foot She is neurovascularly intact at this time Final Clinical Impressions(s) / ED Diagnoses   Final diagnoses:  Closed trimalleolar fracture of right ankle, initial encounter    New Prescriptions New Prescriptions   No medications on file     Zadie Rhine, MD 10/27/16 [phone]

## 2016-10-27 NOTE — Anesthesia Postprocedure Evaluation (Signed)
Anesthesia Post Note  Patient: Kelli SearsMegan L Calhoun  Procedure(s) Performed: OPEN REDUCTION INTERNAL FIXATION (ORIF) RIGHT ANKLE FRACTURE (Right )  Patient location during evaluation: PACU Anesthesia Type: General Level of consciousness: awake and patient cooperative Pain management: pain level controlled Vital Signs Assessment: post-procedure vital signs reviewed and stable Respiratory status: spontaneous breathing, nonlabored ventilation and respiratory function stable Cardiovascular status: blood pressure returned to baseline Postop Assessment: no apparent nausea or vomiting Anesthetic complications: no     Last Vitals:  Vitals:   10/27/16 1415 10/27/16 1636  BP:  123/73  Pulse:  77  Resp: 13 10  Temp:  37.2 C  SpO2: 97% 100%    Last Pain:  Vitals:   10/27/16 1636  TempSrc:   PainSc: (P) 0-No pain                 Serria Sloma J

## 2016-10-27 NOTE — Progress Notes (Signed)
PHARMACIST - PHYSICIAN ORDER COMMUNICATION  CONCERNING: P&T Medication Policy on Herbal Medications  DESCRIPTION:  This patient's order for:  Brain Tablet  has been noted.  This product(s) is classified as an "herbal" or natural product. Due to a lack of definitive safety studies or FDA approval, nonstandard manufacturing practices, plus the potential risk of unknown drug-drug interactions while on inpatient medications, the Pharmacy and Therapeutics Committee does not permit the use of "herbal" or natural products of this type within [facility].   ACTION TAKEN: The pharmacy department is unable to verify this order at this time and your patient has been informed of this safety policy. Please reevaluate patient's clinical condition at discharge and address if the herbal or natural product(s) should be resumed at that time.

## 2016-10-27 NOTE — Op Note (Signed)
10/27/2016  4:30 PM  PATIENT:  Kelli Calhoun  36 y.o. female  PRE-OPERATIVE DIAGNOSIS:  right ankle fracture, trimalleolar  POST-OPERATIVE DIAGNOSIS:  right ankle fracture, trimalleolar  PROCEDURE:  Procedure(s): OPEN REDUCTION INTERNAL FIXATION (ORIF) RIGHT ANKLE FRACTURE (Right)   Ankle solutions lateral plate, medial 2 screws, 1 screw front to back   SURGEON:  Surgeon(s) and Role:    * Harrison, Stanley E, MD - Primary  PHYSICIAN ASSISTANT:   ASSISTANTS: none   ANESTHESIA:   general  EBL:  35 mL   BLOOD ADMINISTERED:none  DRAINS: none   LOCAL MEDICATIONS USED:  MARCAINE     SPECIMEN:  No Specimen  DISPOSITION OF SPECIMEN:  N/A  COUNTS:  YES  TOURNIQUET:   Total Tourniquet Time Documented: Thigh (Right) - 95 minutes Total: Thigh (Right) - 95 minutes   DICTATION: .Dragon Dictation   details of surgical procedure  Surgical findings trimalleolar ankle fracture with posterior malleolar fracture involving 25-30% of the joint surface  Loran Lisowski was identified in the preop area confirmed by date of birth and medical record number. Site marking was used to confirm surgical site chart review was completed  Patient was taken to surgery for general anesthesia. The right lower extremity was prepped and draped with ChloraPrep  Timeout was executed  The limb was exsanguinated with a 4 inch Esmarch. The tourniquet was elevated to 300 mmHg.  A longitudinal incision was made over the lateral malleolus taken down to bone. I made the incision slightly posterior in anticipation of an anterior and medial incision  Full-thickness skin flaps were created and the comminuted fracture was noted. The anterior portion of the fibula was used for the reduction. There was an oblique portion to the fracture which  Align the fracture up. Once the fracture was lined up x-rays confirmed ankle mortise reduction.  The first screw was placed at the oblique portion of the fracture the  second at the acute angle portion of the fracture and then sequential screws were placed. X-rays confirmed reduction of the fracture and the ankle mortise  I then made a curvilinear incision posterior to the medial malleolus. I created full-thickness skin flaps. I irrigated the joint I did not see any cartilage lesion. I placed a pointed reduction clamp and then placed 2 cannulated screws. X-rays confirmed this reduction  I then made a longitudinal incision anteriorly placed a cannulated screw anterior to posterior. I did have to change screw because the first one was too long. I used to countersink to leavew head flush with the bone  Final x-rays confirmed reduction of the fracture and mortise reduction  Always irrigated copiously.Medial and anterior wounds were closed with 2-0 Monocryl and then staples medially3-0 running nylon anteriorly and 0 Monocryl laterally  The patient was placed in a sugar tong splint after sterile dressings were applied  Postop plan is for observation for pain control and physical therapy. no weight bearing for 12 weeks    PLAN OF CARE: Admit for overnight observation  PATIENT DISPOSITION:  PACU - hemodynamically stable.   Delay start of Pharmacological VTE agent (>24hrs) due to surgical blood loss or risk of bleeding: not applicable   [id number]  

## 2016-10-28 LAB — BASIC METABOLIC PANEL
ANION GAP: 6 (ref 5–15)
BUN: 11 mg/dL (ref 6–20)
CHLORIDE: 107 mmol/L (ref 101–111)
CO2: 26 mmol/L (ref 22–32)
Calcium: 8.2 mg/dL — ABNORMAL LOW (ref 8.9–10.3)
Creatinine, Ser: 0.9 mg/dL (ref 0.44–1.00)
GFR calc Af Amer: >60 mL/min (ref >60)
GLUCOSE: 109 mg/dL — AB (ref 65–99)
POTASSIUM: 3.8 mmol/L (ref 3.5–5.1)
Sodium: 139 mmol/L (ref 135–145)

## 2016-10-28 LAB — CBC
HEMATOCRIT: 34.3 % — AB (ref 36.0–46.0)
HEMOGLOBIN: 11.4 g/dL — AB (ref 12.0–15.0)
MCH: 29.5 pg (ref 26.0–34.0)
MCHC: 33.2 g/dL (ref 30.0–36.0)
MCV: 88.9 fL (ref 78.0–100.0)
PLATELETS: 200 10*3/uL (ref 150–400)
RBC: 3.86 MIL/uL — AB (ref 3.87–5.11)
RDW: 12.7 % (ref 11.5–15.5)
WBC: 9.8 10*3/uL (ref 4.0–10.5)

## 2016-10-28 MED ORDER — HYDROCODONE-ACETAMINOPHEN 10-325 MG PO TABS: 1.0000 | ORAL_TABLET | ORAL | 0 refills | Status: DC | PRN

## 2016-10-28 MED ORDER — PROMETHAZINE HCL 12.5 MG PO TABS: 12.5000 mg | ORAL_TABLET | Freq: Four times a day (QID) | ORAL | 0 refills | Status: DC | PRN

## 2016-10-28 MED ORDER — IBUPROFEN 800 MG PO TABS: 800.0000 mg | ORAL_TABLET | Freq: Three times a day (TID) | ORAL | 1 refills | Status: DC | PRN

## 2016-10-28 NOTE — Progress Notes (Signed)
Patient taken to car via wheelchair. Discharge instructions given and patient verbalized understanding.

## 2016-10-28 NOTE — Plan of Care (Signed)
Problem: Pain Managment: Goal: General experience of comfort will improve Outcome: Progressing Patient rates pain a 9 or 10 every time even though she falls asleep during conversation. She states the PRN pain medications are helpful. She does not like using the ice packs.  Problem: Activity: Goal: Risk for activity intolerance will decrease Outcome: Progressing Patient is NWB to RLE. Patient is able to wiggle her toes and lift leg off of the bed. Patient does excellent job of pivot turning in [facility].

## 2016-10-28 NOTE — Evaluation (Addendum)
Physical Therapy Evaluation Patient Details Name: Kelli SearsMegan L Wilcher MRN: [id number] DOB: 1980-02-15 Today's Date: 10/28/2016   History of Present Illness  36 FELL DOWN STAIRS FRACTURED RIGHT ANKLE   Clinical Impression  Pt received lying in bed with husband at bedside and was agreeable to PT evaluation. Pt from home with husband, was completely independent at PLOF, and has 4-5 STE (can reach both handrails). Pt is NWB on RLE. Pt independent with bed mobility, supervision - mod I for transfers, and supervision for amb with bil axillary crutches. Pt impulsive throughout amb with crutches and required cues to decrease speed in order to improve steadiness. Performed stair training with pt and her husband to simulate entering their house; pt ascended with bil axillary crutches and descended with bil handrails. Pt demo'd increased fatigue following and she wished to return to bed at EOS. Pt has follow up with Dr. Romeo AppleHarrison on Friday and PT recommendations will follow surgeons recommendation since pt is NWB for 12 weeks. This PT spoke with Dr. Romeo AppleHarrison via phone who agreed with this plan. Will follow pt acutely to continue to practice safe ambulation and transfers with bil axillary crutches.    Follow Up Recommendations DC plan and follow up therapy as arranged by surgeon;Supervision/Assistance - 24 hour    Equipment Recommendations  Crutches (ordered and left bil axillary crutches in room with pt)    Recommendations for Other Services       Precautions / Restrictions Restrictions Weight Bearing Restrictions: Yes RLE Weight Bearing: Non weight bearing Other Position/Activity Restrictions: RLE      Mobility  Bed Mobility Overal bed mobility: Independent                Transfers Overall transfer level: Needs assistance Equipment used: Crutches Transfers: Sit to/from Stand Sit to Stand: Supervision;Modified independent (Device/Increase time)         General transfer comment:  impulsive with transfers which required supervision, otherwise, pt mod I  Ambulation/Gait Ambulation/Gait assistance: Supervision Ambulation Distance (Feet): 100 Feet Assistive device: Crutches Gait Pattern/deviations: Antalgic;Staggering left;Staggering right     General Gait Details: NWB on RLE throughout gait with bil axillary crutches; swing through gait pattern with bil axillary crutches; cues to decrease speed and impulsivity to decrease risk for LOB; intermittent staggering  Stairs Stairs: Yes Stairs assistance: Mod assist Stair Management: With crutches;Two rails Number of Stairs: 12 General stair comments: ascended with crutches, descended with bil hand rails; mod A throughout for safety and support  Wheelchair Mobility    Modified Rankin (Stroke Patients Only)       Balance Overall balance assessment: Needs assistance Sitting-balance support: Feet supported Sitting balance-Leahy Scale: Good     Standing balance support: Bilateral upper extremity supported Standing balance-Leahy Scale: Fair                               Pertinent Vitals/Pain Pain Assessment: 0-10 Pain Score: 8  Pain Location: R ankle Pain Descriptors / Indicators: Aching;Throbbing Pain Intervention(s): Limited activity within patient's tolerance;Monitored during session;Repositioned    Home Living Family/patient expects to be discharged to:: Private residence Living Arrangements: Spouse/significant other   Type of Home: House Home Access: Stairs to enter Entrance Stairs-Rails: Can reach both Entrance Stairs-Number of Steps: 4 Home Layout: One level Home Equipment: None      Prior Function Level of Independence: Independent               Hand  Dominance   Dominant Hand: Right    Extremity/Trunk Assessment   Upper Extremity Assessment Upper Extremity Assessment: Overall WFL for tasks assessed    Lower Extremity Assessment Lower Extremity Assessment: RLE  deficits/detail RLE Deficits / Details: NWB RLE       Communication   Communication: No difficulties  Cognition Arousal/Alertness: Awake/alert Behavior During Therapy: WFL for tasks assessed/performed Overall Cognitive Status: Within Functional Limits for tasks assessed                                        General Comments      Exercises     Assessment/Plan    PT Assessment Patient needs continued PT services  PT Problem List Decreased strength;Decreased activity tolerance;Decreased balance;Decreased mobility;Decreased safety awareness;Pain       PT Treatment Interventions DME instruction;Gait training;Stair training;Functional mobility training;Therapeutic activities;Therapeutic exercise;Balance training;Patient/family education;Manual techniques    PT Goals (Current goals can be found in the Care Plan section)  Acute Rehab PT Goals Patient Stated Goal: to go home PT Goal Formulation: With patient/family Time For Goal Achievement: 11/01/16 Potential to Achieve Goals: Good    Frequency 7X/week   Barriers to discharge        Co-evaluation               AM-PAC PT "6 Clicks" Daily Activity  Outcome Measure Difficulty turning over in bed (including adjusting bedclothes, sheets and blankets)?: None Difficulty moving from lying on back to sitting on the side of the bed? : None Difficulty sitting down on and standing up from a chair with arms (e.g., wheelchair, bedside commode, etc,.)?: A Little Help needed moving to and from a bed to chair (including a wheelchair)?: None Help needed walking in hospital room?: None Help needed climbing 3-5 steps with a railing? : A Lot 6 Click Score: 21    End of Session Equipment Utilized During Treatment: Gait belt Activity Tolerance: Patient tolerated treatment well;Patient limited by fatigue Patient left: in bed;with call bell/phone within reach;with family/visitor present Nurse Communication: Mobility  status;Precautions;Weight bearing status PT Visit Diagnosis: Unsteadiness on feet (R26.81);Muscle weakness (generalized) (M62.81);Difficulty in walking, not elsewhere classified (R26.2);Other abnormalities of gait and mobility (R26.89)    Time: 1040-1108 PT Time Calculation (min) (ACUTE ONLY): 28 min   Charges:   PT Evaluation $PT Eval Low Complexity: 1 Low PT Treatments $Therapeutic Activity: 8-22 mins   PT G Codes:   PT G-Codes **NOT FOR INPATIENT CLASS** Functional Assessment Tool Used: AM-PAC 6 Clicks Basic Mobility;Clinical judgement Functional Limitation: Mobility: Walking and moving around Mobility: Walking and Moving Around Current Status (Z6109): At least 40 percent but less than 60 percent impaired, limited or restricted Mobility: Walking and Moving Around Goal Status [phone]): At least 40 percent but less than 60 percent impaired, limited or restricted       Jac Canavan PT, DPT

## 2016-10-30 ENCOUNTER — Encounter (HOSPITAL_COMMUNITY): Payer: Self-pay | Admitting: Orthopedic Surgery

## 2016-10-30 NOTE — Discharge Summary (Signed)
Physician Discharge Summary  Patient ID: Kelli Calhoun MRN: [id number] DOB/AGE: 1980-01-21 36 y.o.  Admit date: 10/27/2016 Discharge date: 10/30/2016  Admission Diagnoses: Fracture trimalleolar right ankle  Discharge Diagnoses: Same  Discharged Condition: good  Procedure: Open treatment internal fixation right ankle including posterior malleolus from an anterior approach  Hospital Course: The patient was admitted to the hospital on the day of surgery October 19. She went to surgery for open treatment internal fixation via lateral medial and anterior to posterior approach for trimalleolar fracture. She tolerated that well was admitted for observation  On the next daypostop day #1 hospital day #2 October 20 she was neurovascularly intact she tolerated physical therapy well and was discharged home with a follow-up scheduled with me Dr. Romeo Apple   Discharge Exam: Blood pressure 125/63, pulse 65, temperature 99.4 F (37.4 C), temperature source Oral, resp. rate 18, height 5\' 7"  (1.702 m), weight 220 lb (99.8 kg), last menstrual period 10/21/2016, SpO2 95 %.   Disposition: 01-Home or Self Care  Discharge Instructions    Call MD / Call 911    Complete by:  As directed    If you experience chest pain or shortness of breath, CALL 911 and be transported to the hospital emergency room.  If you develope a fever above 101 F, pus (white drainage) or increased drainage or redness at the wound, or calf pain, call your surgeon's office.   Constipation Prevention    Complete by:  As directed    Drink plenty of fluids.  Prune juice may be helpful.  You may use a stool softener, such as Colace (over the counter) 100 mg twice a day.  Use MiraLax (over the counter) for constipation as needed.   Diet - low sodium heart healthy    Complete by:  As directed    Discharge instructions    Complete by:  As directed    Ice 30 min ev 2 hrs while awake   No weight on foot   No driving   Out of work 4  weeks   Driving restrictions    Complete by:  As directed    No driving   Increase activity slowly as tolerated    Complete by:  As directed      Allergies as of 10/28/2016      Reactions   Levaquin [levofloxacin] Hives      Medication List    STOP taking these medications   naproxen sodium 220 MG tablet Commonly known as:  ANAPROX     TAKE these medications   BRAIN Tabs Take 1 tablet by mouth daily.   fluticasone 50 MCG/ACT nasal spray Commonly known as:  FLONASE Place 1 spray into both nostrils at bedtime.   furosemide 20 MG tablet Commonly known as:  LASIX Take 20 mg by mouth.   HYDROcodone-acetaminophen 10-325 MG tablet Commonly known as:  NORCO Take 1 tablet by mouth every 4 (four) hours as needed.   ibuprofen 800 MG tablet Commonly known as:  ADVIL,MOTRIN Take 1 tablet (800 mg total) by mouth every 8 (eight) hours as needed.   pantoprazole 40 MG tablet Commonly known as:  PROTONIX Take 1 tablet (40 mg total) by mouth 2 (two) times daily before a meal. For one month, then daily thereafter   promethazine 12.5 MG tablet Commonly known as:  PHENERGAN Take 1 tablet (12.5 mg total) by mouth every 6 (six) hours as needed for nausea or vomiting.   scopolamine 1 MG/3DAYS Commonly known as:  TRANSDERM-SCOP Place 1 patch onto the skin every 3 (three) days.   TRINTELLIX 5 MG Tabs Generic drug:  vortioxetine HBr Take 5 mg by mouth daily.      Follow-up Information    Vickki HearingHarrison, Shandra Szymborski E, MD Follow up on 11/03/2016.   Specialties:  Orthopedic Surgery, Radiology Why:  splint change  med refill Contact information: [address] [place] [address] [phone]           Signed: Fuller CanadaStanley Indya Oliveria 10/30/2016, 12:41 PM

## 2016-10-31 ENCOUNTER — Telehealth: Payer: Self-pay | Admitting: Orthopedic Surgery

## 2016-10-31 DIAGNOSIS — Z8781 Personal history of (healed) traumatic fracture: Principal | ICD-10-CM

## 2016-10-31 DIAGNOSIS — Z9889 Other specified postprocedural states: Secondary | ICD-10-CM

## 2016-10-31 NOTE — Telephone Encounter (Signed)
Printed off order for Kelli Calhoun, will you sign if you agree she can have this? It is on your desk, by your computer

## 2016-10-31 NOTE — Telephone Encounter (Signed)
Patient called to ask if Dr Romeo AppleHarrison may prescribe a walker, either the type that has a seat, or a rolling walker, "as an alternative to crutches" - patient is status post ankle fracture surgery (ORIF) on 10/27/16? States if approved, can the prescription be faxed to [facility] in [place]? (patient has hospital fol/up appointment scheduled Friday, 11/03/16.

## 2016-11-01 NOTE — Telephone Encounter (Signed)
Called patient to advise this has been faxed for her to [facility]

## 2016-11-03 ENCOUNTER — Ambulatory Visit (INDEPENDENT_AMBULATORY_CARE_PROVIDER_SITE_OTHER): Payer: PRIVATE HEALTH INSURANCE | Admitting: Orthopedic Surgery

## 2016-11-03 VITALS — BP 129/87 | HR 83 | Ht 67.0 in | Wt 222.0 lb

## 2016-11-03 DIAGNOSIS — Z9889 Other specified postprocedural states: Secondary | ICD-10-CM

## 2016-11-03 DIAGNOSIS — Z4889 Encounter for other specified surgical aftercare: Secondary | ICD-10-CM

## 2016-11-03 DIAGNOSIS — Z967 Presence of other bone and tendon implants: Secondary | ICD-10-CM

## 2016-11-03 DIAGNOSIS — Z8781 Personal history of (healed) traumatic fracture: Secondary | ICD-10-CM

## 2016-11-03 MED ORDER — HYDROCODONE-ACETAMINOPHEN 10-325 MG PO TABS: 1.0000 | ORAL_TABLET | ORAL | 0 refills | Status: DC | PRN

## 2016-11-03 NOTE — Progress Notes (Signed)
Postop visit #1 postop day #7  Chief Complaint  Patient presents with  . Follow-up    Recheck on right ankle, DOS 10-27-16.    Kelli Calhoun complains of weakness in dorsiflexion of her foot  Her wounds look clean dry and intact with sanguinous serous drainage over the anterior portion otherwise wounds are clean dry and intact  She was placed in a new splint with appropriate dressings and she will follow-up in one week for x-rays out of plaster and cast or brace application she is encouraged to continue active range of motion exercises  Meds ordered this encounter  Medications  . HYDROcodone-acetaminophen (NORCO) 10-325 MG tablet    Sig: Take 1 tablet by mouth every 4 (four) hours as needed.    Dispense:  42 tablet    Refill:  0    Encounter Diagnoses  Name Primary?  . S/P ORIF (open reduction internal fixation) fracture right ankle 10/27/16 Yes  . Aftercare following surgery

## 2016-11-06 DIAGNOSIS — Z8781 Personal history of (healed) traumatic fracture: Secondary | ICD-10-CM

## 2016-11-06 DIAGNOSIS — Z9889 Other specified postprocedural states: Secondary | ICD-10-CM | POA: Insufficient documentation

## 2016-11-08 ENCOUNTER — Telehealth: Payer: Self-pay | Admitting: Orthopedic Surgery

## 2016-11-08 NOTE — Telephone Encounter (Signed)
faxed

## 2016-11-08 NOTE — Telephone Encounter (Signed)
Patient called to relay that her prescription for  a walker, the type that has a seat, cannot be filled at [facility], as they are out of network with her insurance. Patient gave us the name of another provider, however, per my call to them, was told that they do not dispense this type of equipment.  I called back to patient, and she will continue to work with her insurer, then call back.

## 2016-11-08 NOTE — Telephone Encounter (Signed)
Patient requests that her prescription for walker with seat be faxed to [facility], at fax#[phone]; states this insurer should be in network w/her insurer.

## 2016-11-10 ENCOUNTER — Ambulatory Visit (INDEPENDENT_AMBULATORY_CARE_PROVIDER_SITE_OTHER): Payer: PRIVATE HEALTH INSURANCE

## 2016-11-10 ENCOUNTER — Encounter: Payer: Self-pay | Admitting: Orthopedic Surgery

## 2016-11-10 ENCOUNTER — Ambulatory Visit (INDEPENDENT_AMBULATORY_CARE_PROVIDER_SITE_OTHER): Payer: PRIVATE HEALTH INSURANCE | Admitting: Orthopedic Surgery

## 2016-11-10 DIAGNOSIS — S82851D Displaced trimalleolar fracture of right lower leg, subsequent encounter for closed fracture with routine healing: Secondary | ICD-10-CM

## 2016-11-10 DIAGNOSIS — Z8781 Personal history of (healed) traumatic fracture: Secondary | ICD-10-CM

## 2016-11-10 DIAGNOSIS — Z967 Presence of other bone and tendon implants: Secondary | ICD-10-CM

## 2016-11-10 DIAGNOSIS — Z9889 Other specified postprocedural states: Secondary | ICD-10-CM

## 2016-11-10 MED ORDER — HYDROCODONE-ACETAMINOPHEN 10-325 MG PO TABS: 1.0000 | ORAL_TABLET | Freq: Four times a day (QID) | ORAL | 0 refills | Status: DC | PRN

## 2016-11-10 NOTE — Patient Instructions (Signed)
No weight-bearing

## 2016-11-10 NOTE — Progress Notes (Signed)
Chief Complaint  Patient presents with  . Routine Post Op    right ankle ORIF 10/27/16   Splint off wound check suture staple removal and X-ray  2 weeks postop trimalleolar fracture anterior posterior posterior malleolar fixation as well as medial lateral fixation  Patient complains of sensory loss and sensitivity last 2 toes plantar aspect of the foot  Most likely neuropraxia  Wounds look clean x-rays look good patient is placed into a short-leg cast with maximum dorsiflexion possible to come back in 4 weeks for repeat x-ray out of plaster and hopefully transition to a Manufacturing systems engineerCam Walker

## 2016-11-13 ENCOUNTER — Encounter: Payer: Self-pay | Admitting: Orthopedic Surgery

## 2016-11-23 ENCOUNTER — Other Ambulatory Visit: Payer: Self-pay | Admitting: Orthopedic Surgery

## 2016-11-23 ENCOUNTER — Telehealth: Payer: Self-pay | Admitting: Orthopedic Surgery

## 2016-11-23 MED ORDER — HYDROCODONE-ACETAMINOPHEN 7.5-325 MG PO TABS: 1.0000 | ORAL_TABLET | Freq: Three times a day (TID) | ORAL | 0 refills | Status: DC | PRN

## 2016-11-23 NOTE — Telephone Encounter (Signed)
Refill request on Hydrocodone/Acetaminophen  10-325  Mgs.   Qty  30   Sig: Take 1 tablet by mouth every 6 (six) hours as needed.

## 2016-11-24 ENCOUNTER — Other Ambulatory Visit: Payer: Self-pay | Admitting: Orthopedic Surgery

## 2016-11-24 MED ORDER — HYDROCODONE-ACETAMINOPHEN 7.5-325 MG PO TABS: 1.0000 | ORAL_TABLET | Freq: Three times a day (TID) | ORAL | 0 refills | Status: DC | PRN

## 2016-11-28 ENCOUNTER — Telehealth: Payer: Self-pay | Admitting: Orthopedic Surgery

## 2016-11-28 NOTE — Telephone Encounter (Signed)
I called her, told her to use the Ibuprofen or the Aleve, but not both. She has voiced understanding. I have also advised to see PCP for headache, she agrees.   To you FYI

## 2016-11-28 NOTE — Telephone Encounter (Signed)
Patient called today wanting to know if she can take Aleve?  She also states that she has had a migraine for 2 days.  Please advise  Thanks

## 2016-12-08 ENCOUNTER — Ambulatory Visit (INDEPENDENT_AMBULATORY_CARE_PROVIDER_SITE_OTHER): Payer: PRIVATE HEALTH INSURANCE

## 2016-12-08 ENCOUNTER — Encounter: Payer: Self-pay | Admitting: Orthopedic Surgery

## 2016-12-08 ENCOUNTER — Ambulatory Visit (INDEPENDENT_AMBULATORY_CARE_PROVIDER_SITE_OTHER): Payer: PRIVATE HEALTH INSURANCE | Admitting: Orthopedic Surgery

## 2016-12-08 DIAGNOSIS — Z9889 Other specified postprocedural states: Secondary | ICD-10-CM

## 2016-12-08 DIAGNOSIS — Z8781 Personal history of (healed) traumatic fracture: Secondary | ICD-10-CM

## 2016-12-08 DIAGNOSIS — Z967 Presence of other bone and tendon implants: Secondary | ICD-10-CM | POA: Diagnosis not present

## 2016-12-08 DIAGNOSIS — Z4889 Encounter for other specified surgical aftercare: Secondary | ICD-10-CM

## 2016-12-08 NOTE — Progress Notes (Signed)
Chief complaint postop right ankle ORIF on October 19 follow-up visit x-rays out of plaster.  Encounter Diagnosis  Name Primary?  . S/P ORIF (open reduction internal fixation) fracture 10/27/16 Yes   6 weeks postop visit x-rays out of plaster, wound check  xrays ankle mortise is intact fracture healing appropriately  Wound all clean dry intact and healed  Foot plantigrade can accept cam walker weightbearing with crutches return to work on Monday, December 3 follow-up 6 weeks repeat x-ray

## 2016-12-15 ENCOUNTER — Ambulatory Visit (INDEPENDENT_AMBULATORY_CARE_PROVIDER_SITE_OTHER): Payer: PRIVATE HEALTH INSURANCE | Admitting: Gastroenterology

## 2016-12-15 ENCOUNTER — Encounter: Payer: Self-pay | Admitting: Gastroenterology

## 2016-12-15 VITALS — BP 136/81 | HR 90 | Temp 97.2°F | Ht 67.0 in | Wt 231.2 lb

## 2016-12-15 DIAGNOSIS — B9681 Helicobacter pylori [H. pylori] as the cause of diseases classified elsewhere: Secondary | ICD-10-CM

## 2016-12-15 DIAGNOSIS — R739 Hyperglycemia, unspecified: Secondary | ICD-10-CM

## 2016-12-15 DIAGNOSIS — K219 Gastro-esophageal reflux disease without esophagitis: Secondary | ICD-10-CM

## 2016-12-15 DIAGNOSIS — R197 Diarrhea, unspecified: Secondary | ICD-10-CM | POA: Diagnosis not present

## 2016-12-15 DIAGNOSIS — K2970 Gastritis, unspecified, without bleeding: Secondary | ICD-10-CM

## 2016-12-15 NOTE — Progress Notes (Signed)
Primary Care Physician: Tylene Fantasia, NP  Primary Gastroenterologist:  Barney Drain, MD   Chief Complaint  Patient presents with  . Rectal Bleeding    f/u. No longer an issue  . Gastroesophageal Reflux    only when she forgets to take her med at night    HPI: Kelli Calhoun is a 36 y.o. female here for follow-up of GERD, epigastric pain, chronic diarrhea.  Back in March complaints of rectal bleeding, GERD waking up in the middle the night with regurgitation.  Complaint of diarrhea around her periods.  EGD and colonoscopy May 22, 2016 showing nonobstructing Schatzki ring, nonbleeding erosions in the gastric antrum, patient pulled the tube out and the study could not be completed.  Her colonoscopy was complete with normal terminal ileum, external and internal hemorrhoids, 2 sessile polyps removed measuring 2-3 mm each.  She had a repeat EGD with MAC on August 15, 2016 demonstrating H. pylori gastritis as well as duodenitis.  Small bowel biopsies negative for celiac disease.  Patient completed H. pylori treatment.  Overall she feels like she is doing pretty well.  She has had a lot of medication changes associated with having broken her foot requiring surgery.  Last couple of menstrual cycle she has had less diarrhea.  She has started taking supplement called Thrive and wants to try Balance her digestive issues.  She ultimately wants to come off of pantoprazole.  She denies dysphagia, vomiting, abdominal pain, melena, rectal bleeding.  She notes that when she has tried to hold pantoprazole or forgets to take pantoprazole at nighttime, she does have recurrent reflux.   Current Outpatient Medications  Medication Sig Dispense Refill  . fluticasone (FLONASE) 50 MCG/ACT nasal spray Place 1 spray into both nostrils at bedtime.     . Multiple Vitamins-Minerals (THRIVE FOR LIFE WOMENS PO) Take 2 tablets by mouth daily.    Marland Kitchen OVER THE COUNTER MEDICATION Patch derma fusion technology DFT.  Change daily    . pantoprazole (PROTONIX) 40 MG tablet Take 1 tablet (40 mg total) by mouth 2 (two) times daily before a meal. For one month, then daily thereafter (Patient taking differently: Take 40 mg by mouth at bedtime. For one month, then daily thereafter) 60 tablet 3  . TRINTELLIX 5 MG TABS Take 5 mg by mouth daily.     No current facility-administered medications for this visit.     Allergies as of 12/15/2016 - Review Complete 12/15/2016  Allergen Reaction Noted  . Levaquin [levofloxacin] Hives 08/07/2016    ROS:  General: Negative for anorexia, weight loss, fever, chills, fatigue, weakness. ENT: Negative for hoarseness, difficulty swallowing , nasal congestion. CV: Negative for chest pain, angina, palpitations, dyspnea on exertion, peripheral edema.  Respiratory: Negative for dyspnea at rest, dyspnea on exertion, cough, sputum, wheezing.  GI: See history of present illness. GU:  Negative for dysuria, hematuria, urinary incontinence, urinary frequency, nocturnal urination.  Endo: Negative for unusual weight change.    Physical Examination:   BP 136/81   Pulse 90   Temp (!) 97.2 F (36.2 C) (Oral)   Ht _0  (1.702 m)   Wt 231 lb 3.2 oz (104.9 kg)   LMP 12/12/2016   BMI 36.21 kg/m   General: Well-nourished, well-developed in no acute distress.  Eyes: No icterus. Mouth: Oropharyngeal mucosa moist and pink , no lesions erythema or exudate. Lungs: Clear to auscultation bilaterally.  Heart: Regular rate and rhythm, no murmurs rubs or gallops.  Abdomen:  Bowel sounds are normal, nontender, nondistended, no hepatosplenomegaly or masses, no abdominal bruits or hernia , no rebound or guarding.   Extremities: No lower extremity edema. No clubbing or deformities. Neuro: Alert and oriented x 4   Skin: Warm and dry, no jaundice.   Psych: Alert and cooperative, normal mood and affect.   Imaging Studies: Dg Ankle Complete Right  Result Date: 12/08/2016 [place]  Radiology report dictated by Dr. Aline Brochure 3 views right ankle status post open treatment internal fixation for trimalleolar fracture One screw goes anterior to posterior reduces the posterior malleolar fragment anatomically Lateral plate with comminuted fibular fracture bridge plating technique used Medial screws intact medial malleolus Ankle mortise intact fracture appears to be in good position Impression stable fixation right ankle with internal plate screw construct

## 2016-12-15 NOTE — Patient Instructions (Signed)
1. Please have H.pylori breath test and A1C done in about two weeks after you stop pantoprazole. I will look into the Balance and Thrive to make sure it doesn't affect the test. Be looking for a Mychart message with this information.    Food Choices for Gastroesophageal Reflux Disease, Adult When you have gastroesophageal reflux disease (GERD), the foods you eat and your eating habits are very important. Choosing the right foods can help ease the discomfort of GERD. Consider working with a diet and nutrition specialist (dietitian) to help you make healthy food choices. What general guidelines should I follow? Eating plan  Choose healthy foods low in fat, such as fruits, vegetables, whole grains, low-fat dairy products, and lean meat, fish, and poultry.  Eat frequent, small meals instead of three large meals each day. Eat your meals slowly, in a relaxed setting. Avoid bending over or lying down until 2-3 hours after eating.  Limit high-fat foods such as fatty meats or fried foods.  Limit your intake of oils, butter, and shortening to less than 8 teaspoons each day.  Avoid the following: ? Foods that cause symptoms. These may be different for different people. Keep a food diary to keep track of foods that cause symptoms. ? Alcohol. ? Drinking large amounts of liquid with meals. ? Eating meals during the 2-3 hours before bed.  Cook foods using methods other than frying. This may include baking, grilling, or broiling. Lifestyle   Maintain a healthy weight. Ask your health care provider what weight is healthy for you. If you need to lose weight, work with your health care provider to do so safely.  Exercise for at least 30 minutes on 5 or more days each week, or as told by your health care provider.  Avoid wearing clothes that fit tightly around your waist and chest.  Do not use any products that contain nicotine or tobacco, such as cigarettes and e-cigarettes. If you need help quitting,  ask your health care provider.  Sleep with the head of your bed raised. Use a wedge under the mattress or blocks under the bed frame to raise the head of the bed. What foods are not recommended? The items listed may not be a complete list. Talk with your dietitian about what dietary choices are best for you. Grains Pastries or quick breads with added fat. [place] toast. Vegetables Deep fried vegetables. [place] fries. Any vegetables prepared with added fat. Any vegetables that cause symptoms. For some people this may include tomatoes and tomato products, chili peppers, onions and garlic, and horseradish. Fruits Any fruits prepared with added fat. Any fruits that cause symptoms. For some people this may include citrus fruits, such as oranges, grapefruit, pineapple, and lemons. Meats and other protein foods High-fat meats, such as fatty beef or pork, hot dogs, ribs, ham, sausage, salami and bacon. Fried meat or protein, including fried fish and fried chicken. Nuts and nut butters. Dairy Whole milk and chocolate milk. Sour cream. Cream. Ice cream. Cream cheese. Milk shakes. Beverages Coffee and tea, with or without caffeine. Carbonated beverages. Sodas. Energy drinks. Fruit juice made with acidic fruits (such as orange or grapefruit). Tomato juice. Alcoholic drinks. Fats and oils Butter. Margarine. Shortening. Ghee. Sweets and desserts Chocolate and cocoa. Donuts. Seasoning and other foods Pepper. Peppermint and spearmint. Any condiments, herbs, or seasonings that cause symptoms. For some people, this may include curry, hot sauce, or vinegar-based salad dressings. Summary  When you have gastroesophageal reflux disease (GERD), food and lifestyle  choices are very important to help ease the discomfort of GERD.  Eat frequent, small meals instead of three large meals each day. Eat your meals slowly, in a relaxed setting. Avoid bending over or lying down until 2-3 hours after eating.  Limit  high-fat foods such as fatty meat or fried foods. This information is not intended to replace advice given to you by your health care provider. Make sure you discuss any questions you have with your health care provider. Document Released: 12/26/2004 Document Revised: 12/28/2015 Document Reviewed: 12/28/2015 Elsevier Interactive Patient Education  2017 [employer]

## 2016-12-18 ENCOUNTER — Encounter: Payer: Self-pay | Admitting: Gastroenterology

## 2016-12-18 NOTE — Assessment & Plan Note (Signed)
Mostly nocturnal symptoms but well controlled with PPI at night. She however wants to come off meds and therefore is trying some supplements, Thrive, to help her. Plans to start Balance. Reinforced antireflux measures.

## 2016-12-18 NOTE — Assessment & Plan Note (Signed)
Will confirm eradication with H.pylori breath test in near future.

## 2016-12-18 NOTE — Assessment & Plan Note (Signed)
Not as much of a problem lately. Discussed possibility of bentyl around her menses if needed. She wants to hold off for now.   Noted glucose up several times. Check HgbA1C.

## 2016-12-20 NOTE — Progress Notes (Signed)
CC'D TO PCP °

## 2016-12-21 ENCOUNTER — Encounter: Payer: Self-pay | Admitting: Gastroenterology

## 2017-01-17 ENCOUNTER — Encounter: Payer: Self-pay | Admitting: Orthopedic Surgery

## 2017-01-17 ENCOUNTER — Ambulatory Visit (INDEPENDENT_AMBULATORY_CARE_PROVIDER_SITE_OTHER): Payer: PRIVATE HEALTH INSURANCE | Admitting: Orthopedic Surgery

## 2017-01-17 ENCOUNTER — Ambulatory Visit (INDEPENDENT_AMBULATORY_CARE_PROVIDER_SITE_OTHER): Payer: PRIVATE HEALTH INSURANCE

## 2017-01-17 VITALS — BP 133/89 | HR 98 | Ht 67.0 in | Wt 231.0 lb

## 2017-01-17 DIAGNOSIS — Z9889 Other specified postprocedural states: Secondary | ICD-10-CM

## 2017-01-17 DIAGNOSIS — S82851D Displaced trimalleolar fracture of right lower leg, subsequent encounter for closed fracture with routine healing: Secondary | ICD-10-CM

## 2017-01-17 DIAGNOSIS — Z8781 Personal history of (healed) traumatic fracture: Secondary | ICD-10-CM

## 2017-01-17 DIAGNOSIS — Z967 Presence of other bone and tendon implants: Secondary | ICD-10-CM

## 2017-01-17 NOTE — Progress Notes (Signed)
Post op global period   Chief Complaint  Patient presents with  . Follow-up    Fracture Care Right Ankle DOS 10/27/16   Postop week #12 trimalleolar ankle fracture medial lateral and/or posterior fixation  The patient has 1 complaint of numbness on the bottom of the foot lateral aspect and fifth and fourth digit.  Otherwise her ankle looks good her x-rays look good she has started some walking without the boot on in the house with no problems  Her x-rays today were taken fractures are healing appropriately ankle mortise is reduced well colitis in the anterior row lateral portion of the tibial talar region.  Recommend weightbearing as tolerated weaning process for removing the boot and getting weight of the crutches come back in 2 months for a checkup

## 2017-03-14 ENCOUNTER — Ambulatory Visit: Payer: PRIVATE HEALTH INSURANCE | Admitting: Orthopedic Surgery

## 2018-11-22 ENCOUNTER — Other Ambulatory Visit: Payer: Self-pay

## 2018-11-22 DIAGNOSIS — Z20822 Contact with and (suspected) exposure to covid-19: Secondary | ICD-10-CM

## 2018-11-25 LAB — NOVEL CORONAVIRUS, NAA: SARS-CoV-2, NAA: NOT DETECTED

## 2018-11-28 ENCOUNTER — Other Ambulatory Visit: Payer: Self-pay

## 2018-11-28 DIAGNOSIS — Z20822 Contact with and (suspected) exposure to covid-19: Secondary | ICD-10-CM

## 2018-12-01 LAB — NOVEL CORONAVIRUS, NAA: SARS-CoV-2, NAA: NOT DETECTED

## 2019-05-22 ENCOUNTER — Ambulatory Visit: Payer: PRIVATE HEALTH INSURANCE | Attending: Internal Medicine

## 2019-05-22 ENCOUNTER — Other Ambulatory Visit: Payer: Self-pay

## 2019-05-22 DIAGNOSIS — Z20822 Contact with and (suspected) exposure to covid-19: Secondary | ICD-10-CM

## 2019-05-23 LAB — SARS-COV-2, NAA 2 DAY TAT

## 2019-05-23 LAB — NOVEL CORONAVIRUS, NAA: SARS-CoV-2, NAA: NOT DETECTED

## 2020-10-06 ENCOUNTER — Ambulatory Visit
Admission: EM | Admit: 2020-10-06 | Discharge: 2020-10-06 | Disposition: A | Discharge status: Home / Self Care | Payer: BC Managed Care – PPO | Attending: Physician Assistant | Admitting: Physician Assistant

## 2020-10-06 ENCOUNTER — Encounter: Payer: Self-pay | Admitting: Emergency Medicine

## 2020-10-06 ENCOUNTER — Other Ambulatory Visit: Payer: Self-pay

## 2020-10-06 DIAGNOSIS — Z113 Encounter for screening for infections with a predominantly sexual mode of transmission: Secondary | ICD-10-CM | POA: Insufficient documentation

## 2020-10-06 DIAGNOSIS — N898 Other specified noninflammatory disorders of vagina: Secondary | ICD-10-CM | POA: Diagnosis present

## 2020-10-06 DIAGNOSIS — Z20822 Contact with and (suspected) exposure to covid-19: Secondary | ICD-10-CM | POA: Insufficient documentation

## 2020-10-06 DIAGNOSIS — N760 Acute vaginitis: Secondary | ICD-10-CM | POA: Insufficient documentation

## 2020-10-06 DIAGNOSIS — R350 Frequency of micturition: Secondary | ICD-10-CM | POA: Diagnosis present

## 2020-10-06 DIAGNOSIS — B349 Viral infection, unspecified: Secondary | ICD-10-CM | POA: Diagnosis present

## 2020-10-06 LAB — POCT URINALYSIS DIP (MANUAL ENTRY)
Bilirubin, UA: NEGATIVE
Glucose, UA: NEGATIVE mg/dL
Ketones, POC UA: NEGATIVE mg/dL
Leukocytes, UA: NEGATIVE
Nitrite, UA: NEGATIVE
Spec Grav, UA: >=1.03 — AB (ref 1.010–1.025)
Urobilinogen, UA: 0.2 E.U./dL
pH, UA: 5.5 (ref 5.0–8.0)

## 2020-10-06 LAB — POCT URINE PREGNANCY: Preg Test, Ur: NEGATIVE

## 2020-10-06 NOTE — ED Provider Notes (Signed)
[facility]    CSN: [id number] Arrival date & time: 10/06/20  1426      History   Chief Complaint No chief complaint on file.   HPI Kelli Calhoun is a 40 y.o. female.   The history is provided by the patient. No language interpreter was used.  Cough Cough characteristics:  Non-productive Sputum characteristics:  Nondescript Severity:  Moderate Onset quality:  Gradual Duration:  4 days Timing:  Constant Progression:  Worsening Chronicity:  New Smoker: no   Context: upper respiratory infection   Relieved by:  Nothing Worsened by:  Nothing Ineffective treatments:  None tried Associated symptoms: sinus congestion and sore throat   Risk factors: no recent infection    Past Medical History:  Diagnosis Date   Anemia    Anxiety    Back pain    Cervical dysplasia    as a teenager   Depression    Dyspnea    GERD (gastroesophageal reflux disease)    Helicobacter pylori gastritis 08/2016   History of frequent urinary tract infections    Pre-eclampsia     Patient Active Problem List   Diagnosis Date Noted   S/P ORIF (open reduction internal fixation) fracture 10/27/16 11/06/2016   Closed trimalleolar fracture of right ankle 10/27/2016   Trimalleolar fracture of ankle, closed, right, with routine healing, subsequent encounter 10/27/2016   Helicobacter pylori gastritis 08/09/2016   Diarrhea    Gastritis due to nonsteroidal anti-inflammatory drug    Rectal bleeding 03/24/2016   LLQ pain 03/24/2016   GERD (gastroesophageal reflux disease) 03/24/2016   Abdominal pain, epigastric 03/24/2016   Constipation 03/24/2016   Cervicalgia 08/20/2013   Chronic tension headaches 08/20/2013   Lumbago 07/29/2013   Spondylolisthesis at L5-S1 level 07/29/2013   Neck pain 07/29/2013   Sciatica 05/29/2013   CTS (carpal tunnel syndrome) 05/29/2013   Overweight 12/18/2012   Restless leg syndrome 12/18/2012   Carpal tunnel syndrome 12/18/2012   Heavy periods 12/18/2012     Past Surgical History:  Procedure Laterality Date   APPENDECTOMY     BIOPSY  05/22/2016   Procedure: BIOPSY;  Surgeon: West Bali, MD;  Location: [facility];  Service: Endoscopy;;  random colon   COLONOSCOPY N/A 05/22/2016   Procedure: COLONOSCOPY;  Surgeon: West Bali, MD;  Location: [facility];  Service: Endoscopy;  Laterality: N/A;  12:15 pm   ESOPHAGOGASTRODUODENOSCOPY N/A 05/22/2016   Procedure: ESOPHAGOGASTRODUODENOSCOPY (EGD);  Surgeon: West Bali, MD;  Location: [facility];  Service: Endoscopy;  Laterality: N/A;   ESOPHAGOGASTRODUODENOSCOPY (EGD) WITH PROPOFOL N/A 08/15/2016   Procedure: ESOPHAGOGASTRODUODENOSCOPY (EGD) WITH PROPOFOL;  Surgeon: West Bali, MD;  Location: [facility];  Service: Endoscopy;  Laterality: N/A;   FRACTURE SURGERY Left    wrist   MOLE REMOVAL     abdomen   ORIF ANKLE FRACTURE Right 10/27/2016   Procedure: OPEN REDUCTION INTERNAL FIXATION (ORIF) RIGHT ANKLE FRACTURE;  Surgeon: Vickki Hearing, MD;  Location: [facility];  Service: Orthopedics;  Laterality: Right;   POLYPECTOMY  05/22/2016   Procedure: POLYPECTOMY;  Surgeon: West Bali, MD;  Location: [facility];  Service: Endoscopy;;  sigmoid colon x2    OB History     Gravida  3   Para  3   Term  1   Preterm  2   AB      Living         SAB      IAB  Ectopic      Multiple      Live Births               Home Medications    Prior to Admission medications   Medication Sig Start Date End Date Taking? Authorizing Provider  fluticasone (FLONASE) 50 MCG/ACT nasal spray Place 1 spray into both nostrils at bedtime.     [provider]  Multiple Vitamins-Minerals (THRIVE FOR LIFE WOMENS PO) Take 2 tablets by mouth daily.    [provider]  OVER THE COUNTER MEDICATION Patch derma fusion technology DFT. Change daily    [provider]  pantoprazole (PROTONIX) 40 MG tablet Take 1 tablet (40 mg total) by mouth 2 (two)  times daily before a meal. For one month, then daily thereafter Patient taking differently: Take 40 mg by mouth at bedtime. For one month, then daily thereafter 08/22/16   Tiffany Kocher, PA-C  TRINTELLIX 5 MG TABS Take 5 mg by mouth daily. 07/31/16   [provider]    Family History Family History  Problem Relation Age of Onset   Cancer Mother        lung cancer, died at 61   Hypertension Father        died 9   Diabetes Father    Stroke Father    Cancer Other    Alzheimer's disease Other    Brain cancer Maternal Grandfather    Leukemia Maternal Aunt    Alzheimer's disease Maternal Grandmother    Colon cancer Neg Hx     Social History Social History   Tobacco Use   Smoking status: Former    Packs/day: 1.00    Years: 15.00    Pack years: 15.00    Types: Cigarettes    Quit date: 05/22/2008    Years since quitting: 12.3   Smokeless tobacco: Never  Vaping Use   Vaping Use: Never used  Substance Use Topics   Alcohol use: Yes    Comment: socially, once every couple of weeks   Drug use: No     Allergies   Levaquin [levofloxacin]   Review of Systems Review of Systems  HENT:  Positive for sore throat.   Respiratory:  Positive for cough.   All other systems reviewed and are negative.   Physical Exam Triage Vital Signs ED Triage Vitals  Enc Vitals Group     BP 10/06/20 1603 117/73     Pulse Rate 10/06/20 1603 83     Resp 10/06/20 1603 18     Temp 10/06/20 1603 98 F (36.7 C)     Temp Source 10/06/20 1603 Oral     SpO2 10/06/20 1603 98 %     Weight --      Height --      Head Circumference --      Peak Flow --      Pain Score 10/06/20 1605 5     Pain Loc --      Pain Edu? --      Excl. in GC? --    No data found.  Updated Vital Signs BP 117/73 (BP Location: Right Arm)   Pulse 83   Temp 98 F (36.7 C) (Oral)   Resp 18   LMP 10/02/2020 (Exact Date)   SpO2 98%   Visual Acuity Right Eye Distance:   Left Eye Distance:   Bilateral  Distance:    Right Eye Near:   Left Eye Near:    Bilateral Near:  Physical Exam Vitals reviewed.  HENT:     Head: Normocephalic and atraumatic.     Right Ear: Tympanic membrane normal.     Left Ear: Tympanic membrane normal.     Nose: Nose normal.     Mouth/Throat:     Mouth: Mucous membranes are moist.  Eyes:     Extraocular Movements: Extraocular movements intact.     Pupils: Pupils are equal, round, and reactive to light.  Cardiovascular:     Rate and Rhythm: Normal rate.  Pulmonary:     Effort: Pulmonary effort is normal.  Skin:    General: Skin is warm.  Neurological:     General: No focal deficit present.     Mental Status: She is alert.  Psychiatric:        Mood and Affect: Mood normal.     UC Treatments / Results  Labs (all labs ordered are listed, but only abnormal results are displayed) Labs Reviewed  POCT URINALYSIS DIP (MANUAL ENTRY) - Abnormal; Notable for the following components:      Result Value   Spec Grav, UA >=1.030 (*)    Blood, UA trace-lysed (*)    Protein Ur, POC trace (*)    All other components within normal limits  COVID-19, FLU A+B NAA  POCT URINE PREGNANCY  CERVICOVAGINAL ANCILLARY ONLY    EKG   Radiology No results found.  Procedures Procedures (including critical care time)  Medications Ordered in UC Medications - No data to display  Initial Impression / Assessment and Plan / UC Course  I have reviewed the triage vital signs and the nursing notes.  Pertinent labs & imaging results that were available during my care of the patient were reviewed by me and considered in my medical decision making (see chart for details).     MDM:  covid, influenza pending  Pt advised symptomatic care  Final Clinical Impressions(s) / UC Diagnoses   Final diagnoses:  Exposure to COVID-19 virus  Viral illness     Discharge Instructions      Your test are pending    ED Prescriptions   None    PDMP not reviewed this  encounter. An After Visit Summary was printed and given to the patient.    Elson Areas, [place] 10/06/20 1702

## 2020-10-06 NOTE — Discharge Instructions (Signed)
Your test are pending 

## 2020-10-06 NOTE — ED Triage Notes (Signed)
Fever on Sunday, pain behind eyes, right ear pain, nauseated yesterday, sore throat.  Wants STD and pregnancy test.  States she is have urinary frequency with lower back pain. States is having a white vaginal discharge.

## 2020-10-07 ENCOUNTER — Telehealth (HOSPITAL_COMMUNITY): Payer: Self-pay | Admitting: Emergency Medicine

## 2020-10-07 LAB — CERVICOVAGINAL ANCILLARY ONLY
Bacterial Vaginitis (gardnerella): POSITIVE — AB
Candida Glabrata: NEGATIVE
Candida Vaginitis: NEGATIVE
Chlamydia: NEGATIVE
Comment: NEGATIVE
Comment: NEGATIVE
Comment: NEGATIVE
Comment: NEGATIVE
Comment: NEGATIVE
Comment: NORMAL
Neisseria Gonorrhea: NEGATIVE
Trichomonas: NEGATIVE

## 2020-10-07 LAB — COVID-19, FLU A+B NAA
Influenza A, NAA: NOT DETECTED
Influenza B, NAA: NOT DETECTED
SARS-CoV-2, NAA: DETECTED — AB

## 2020-10-07 MED ORDER — METRONIDAZOLE 500 MG PO TABS: 500.0000 mg | ORAL_TABLET | Freq: Two times a day (BID) | ORAL | 0 refills | Status: DC

## 2021-02-10 ENCOUNTER — Emergency Department (HOSPITAL_COMMUNITY): Payer: BC Managed Care – PPO

## 2021-02-10 ENCOUNTER — Emergency Department (HOSPITAL_COMMUNITY)
Admission: EM | Admit: 2021-02-10 | Discharge: 2021-02-10 | Disposition: A | Discharge status: Home / Self Care | Payer: BC Managed Care – PPO | Attending: Emergency Medicine | Admitting: Emergency Medicine

## 2021-02-10 ENCOUNTER — Other Ambulatory Visit: Payer: Self-pay

## 2021-02-10 ENCOUNTER — Encounter (HOSPITAL_COMMUNITY): Payer: Self-pay | Admitting: Emergency Medicine

## 2021-02-10 DIAGNOSIS — S161XXA Strain of muscle, fascia and tendon at neck level, initial encounter: Secondary | ICD-10-CM | POA: Diagnosis not present

## 2021-02-10 DIAGNOSIS — Y92410 Unspecified street and highway as the place of occurrence of the external cause: Secondary | ICD-10-CM | POA: Insufficient documentation

## 2021-02-10 DIAGNOSIS — M79671 Pain in right foot: Secondary | ICD-10-CM | POA: Insufficient documentation

## 2021-02-10 DIAGNOSIS — S82891A Other fracture of right lower leg, initial encounter for closed fracture: Secondary | ICD-10-CM

## 2021-02-10 DIAGNOSIS — S8251XA Displaced fracture of medial malleolus of right tibia, initial encounter for closed fracture: Secondary | ICD-10-CM | POA: Diagnosis not present

## 2021-02-10 DIAGNOSIS — S39012A Strain of muscle, fascia and tendon of lower back, initial encounter: Secondary | ICD-10-CM | POA: Diagnosis not present

## 2021-02-10 DIAGNOSIS — S99911A Unspecified injury of right ankle, initial encounter: Secondary | ICD-10-CM | POA: Diagnosis present

## 2021-02-10 LAB — POC URINE PREG, ED: Preg Test, Ur: NEGATIVE

## 2021-02-10 MED ORDER — IBUPROFEN 800 MG PO TABS
800.0000 mg | ORAL_TABLET | Freq: Once | ORAL | Status: AC
  Administered 2021-02-10: 800 mg via ORAL
  Filled 2021-02-10: qty 1

## 2021-02-10 MED ORDER — METHOCARBAMOL 500 MG PO TABS: 500.0000 mg | ORAL_TABLET | Freq: Three times a day (TID) | ORAL | 0 refills | Status: AC

## 2021-02-10 MED ORDER — METHOCARBAMOL 500 MG PO TABS
500.0000 mg | ORAL_TABLET | Freq: Once | ORAL | Status: AC
  Administered 2021-02-10: 500 mg via ORAL
  Filled 2021-02-10: qty 1

## 2021-02-10 MED ORDER — HYDROCODONE-ACETAMINOPHEN 5-325 MG PO TABS
ORAL_TABLET | ORAL | 0 refills | Status: DC
Start: 2021-02-10 — End: 2021-03-15

## 2021-02-10 MED ORDER — IBUPROFEN 800 MG PO TABS: 800.0000 mg | ORAL_TABLET | Freq: Three times a day (TID) | ORAL | 0 refills | Status: AC

## 2021-02-10 NOTE — ED Notes (Signed)
Pt to ct 
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
## 2021-02-10 NOTE — Discharge Instructions (Signed)
Elevate your foot and apply ice packs on and off when possible.  Use the boot when walking or standing.  You may remove at bedtime and for bathing.  Apply ice packs on and off to your neck and lower back.  Follow-up with your orthopedic provider for recheck.  Return to the emergency department for any new or worsening symptoms.

## 2021-02-10 NOTE — ED Notes (Signed)
Patient transported to CT 

## 2021-02-17 ENCOUNTER — Ambulatory Visit: Payer: BC Managed Care – PPO | Admitting: Orthopedic Surgery

## 2021-02-17 ENCOUNTER — Encounter: Payer: Self-pay | Admitting: Orthopedic Surgery

## 2021-02-17 ENCOUNTER — Telehealth: Payer: Self-pay | Admitting: Orthopedic Surgery

## 2021-02-17 ENCOUNTER — Other Ambulatory Visit: Payer: Self-pay

## 2021-02-17 VITALS — BP 142/96 | HR 68 | Ht 67.0 in | Wt 190.0 lb

## 2021-02-17 DIAGNOSIS — M51369 Other intervertebral disc degeneration, lumbar region without mention of lumbar back pain or lower extremity pain: Secondary | ICD-10-CM

## 2021-02-17 DIAGNOSIS — S93621A Sprain of tarsometatarsal ligament of right foot, initial encounter: Secondary | ICD-10-CM | POA: Diagnosis not present

## 2021-02-17 DIAGNOSIS — M5136 Other intervertebral disc degeneration, lumbar region: Secondary | ICD-10-CM

## 2021-02-17 DIAGNOSIS — M5030 Other cervical disc degeneration, unspecified cervical region: Secondary | ICD-10-CM

## 2021-02-17 NOTE — Patient Instructions (Addendum)
Out of work note from Monday to Monday  Return to work Monday  No weightbearing  Return in 4 weeks  Physical therapy has been ordered for you at [facility]. They should call you to schedule, [phone] is the phone number to call, if you want to call to schedule.

## 2021-02-17 NOTE — Telephone Encounter (Signed)
Called back to patient; relayed; voiced understanding. 

## 2021-02-17 NOTE — Telephone Encounter (Signed)
I told her we do not refer to chiropractor  If she wants to see one she can make appointments Also told her she may want to try the therapy first.  We already discussed,
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
## 2021-02-17 NOTE — Telephone Encounter (Signed)
Patient asked at check-out about her neck and back issuese; aware she received the referral for physical therapy, and asked if she may be referred to a chiropractor also, if Dr Romeo Apple would recommend? Please advise.

## 2021-03-08 ENCOUNTER — Other Ambulatory Visit: Payer: Self-pay

## 2021-03-08 ENCOUNTER — Ambulatory Visit (HOSPITAL_COMMUNITY): Payer: BC Managed Care – PPO | Attending: Orthopedic Surgery | Admitting: Physical Therapy

## 2021-03-08 DIAGNOSIS — M5030 Other cervical disc degeneration, unspecified cervical region: Secondary | ICD-10-CM | POA: Diagnosis present

## 2021-03-08 DIAGNOSIS — M5136 Other intervertebral disc degeneration, lumbar region: Secondary | ICD-10-CM | POA: Insufficient documentation

## 2021-03-08 DIAGNOSIS — R29898 Other symptoms and signs involving the musculoskeletal system: Secondary | ICD-10-CM | POA: Insufficient documentation

## 2021-03-08 DIAGNOSIS — Z789 Other specified health status: Secondary | ICD-10-CM | POA: Diagnosis not present

## 2021-03-08 NOTE — Patient Instructions (Signed)
Access Code: [email or web address] URL: [email or web address] Date: 03/08/2021 Prepared by: Creig Hines  Exercises Thoracic Extension Mobilization on Foam Roll - 2-3 x daily - 7 x weekly - 3 sets - 10 reps - 2s hold Child's Pose Stretch - 2-3 x daily - 7 x weekly - 12 reps - 3s hold Prone Press Up - 2-3 x daily - 7 x weekly - 12 reps - 3s hold Supine Lower Trunk Rotation - 2-3 x daily - 7 x weekly - 14 reps - 1s hold
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
## 2021-03-10 ENCOUNTER — Ambulatory Visit (HOSPITAL_COMMUNITY): Payer: BC Managed Care – PPO

## 2021-03-15 ENCOUNTER — Ambulatory Visit (INDEPENDENT_AMBULATORY_CARE_PROVIDER_SITE_OTHER): Payer: BC Managed Care – PPO | Admitting: Orthopedic Surgery

## 2021-03-15 ENCOUNTER — Encounter: Payer: Self-pay | Admitting: Orthopedic Surgery

## 2021-03-15 ENCOUNTER — Other Ambulatory Visit: Payer: Self-pay

## 2021-03-15 ENCOUNTER — Ambulatory Visit (HOSPITAL_COMMUNITY): Payer: BC Managed Care – PPO | Attending: Orthopedic Surgery | Admitting: Physical Therapy

## 2021-03-15 DIAGNOSIS — M5136 Other intervertebral disc degeneration, lumbar region: Secondary | ICD-10-CM | POA: Diagnosis present

## 2021-03-15 DIAGNOSIS — R293 Abnormal posture: Secondary | ICD-10-CM | POA: Insufficient documentation

## 2021-03-15 DIAGNOSIS — R29898 Other symptoms and signs involving the musculoskeletal system: Secondary | ICD-10-CM | POA: Diagnosis not present

## 2021-03-15 DIAGNOSIS — Z789 Other specified health status: Secondary | ICD-10-CM | POA: Insufficient documentation

## 2021-03-15 DIAGNOSIS — M5030 Other cervical disc degeneration, unspecified cervical region: Secondary | ICD-10-CM | POA: Insufficient documentation

## 2021-03-15 DIAGNOSIS — S93621D Sprain of tarsometatarsal ligament of right foot, subsequent encounter: Secondary | ICD-10-CM | POA: Diagnosis not present

## 2021-03-15 DIAGNOSIS — S199XXD Unspecified injury of neck, subsequent encounter: Secondary | ICD-10-CM | POA: Insufficient documentation

## 2021-03-15 MED ORDER — HYDROCODONE-ACETAMINOPHEN 5-325 MG PO TABS: 1.0000 | ORAL_TABLET | Freq: Three times a day (TID) | ORAL | 0 refills | Status: AC | PRN
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
## 2021-03-15 NOTE — Patient Instructions (Signed)
Normal shoes ? ?Ibuprofen  ? ?Hydrocodone ? ?

## 2021-03-17 ENCOUNTER — Other Ambulatory Visit: Payer: Self-pay

## 2021-03-17 ENCOUNTER — Ambulatory Visit (HOSPITAL_COMMUNITY): Payer: BC Managed Care – PPO | Admitting: Physical Therapy

## 2021-03-17 ENCOUNTER — Ambulatory Visit: Payer: BC Managed Care – PPO | Admitting: Orthopedic Surgery

## 2021-03-17 DIAGNOSIS — M5136 Other intervertebral disc degeneration, lumbar region: Secondary | ICD-10-CM

## 2021-03-17 DIAGNOSIS — M5030 Other cervical disc degeneration, unspecified cervical region: Secondary | ICD-10-CM
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
## 2021-03-22 ENCOUNTER — Encounter (HOSPITAL_COMMUNITY): Payer: BC Managed Care – PPO

## 2021-03-22 ENCOUNTER — Telehealth (HOSPITAL_COMMUNITY): Payer: Self-pay

## 2021-03-22 DIAGNOSIS — M5136 Other intervertebral disc degeneration, lumbar region: Secondary | ICD-10-CM

## 2021-03-22 DIAGNOSIS — M5030 Other cervical disc degeneration, unspecified cervical region: Secondary | ICD-10-CM

## 2021-03-22 DIAGNOSIS — Z789 Other specified health status: Secondary | ICD-10-CM

## 2021-03-22 DIAGNOSIS — R29898 Other symptoms and signs involving the musculoskeletal system: Secondary | ICD-10-CM

## 2021-03-22 NOTE — Telephone Encounter (Signed)
Patient called after first no show appointment today. Left voice mail to remind of next appointment on Thursday and to please call with 24 hour notice if she can not make it.   ? ? ?8:38 AM, 03/22/21 ? ?Margarette Asal Carlis Abbott, PT, DPT  ?Contract Physical Therapist at  ?[facility] ?[phone] ? ?

## 2021-03-24 ENCOUNTER — Encounter (HOSPITAL_COMMUNITY): Payer: Self-pay | Admitting: Physical Therapy

## 2021-03-24 ENCOUNTER — Ambulatory Visit (HOSPITAL_COMMUNITY): Payer: BC Managed Care – PPO | Admitting: Physical Therapy

## 2021-03-24 ENCOUNTER — Other Ambulatory Visit: Payer: Self-pay

## 2021-03-24 DIAGNOSIS — M5136 Other intervertebral disc degeneration, lumbar region: Secondary | ICD-10-CM | POA: Diagnosis not present
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
## 2021-03-29 ENCOUNTER — Encounter (HOSPITAL_COMMUNITY): Payer: BC Managed Care – PPO | Admitting: Physical Therapy

## 2021-03-29 ENCOUNTER — Telehealth (HOSPITAL_COMMUNITY): Payer: Self-pay | Admitting: Physical Therapy

## 2021-03-29 NOTE — Telephone Encounter (Signed)
She called stating her stomach is not feeling well and she will not be here ?

## 2021-03-31 ENCOUNTER — Other Ambulatory Visit: Payer: Self-pay

## 2021-03-31 ENCOUNTER — Ambulatory Visit (HOSPITAL_COMMUNITY): Payer: BC Managed Care – PPO | Admitting: Physical Therapy

## 2021-03-31 ENCOUNTER — Encounter (HOSPITAL_COMMUNITY): Payer: Self-pay | Admitting: Physical Therapy

## 2021-03-31 DIAGNOSIS — M5136 Other intervertebral disc degeneration, lumbar region: Secondary | ICD-10-CM

## 2021-03-31 DIAGNOSIS — Z789 Other specified health status: Secondary | ICD-10-CM

## 2021-03-31 DIAGNOSIS — M5030 Other cervical disc degeneration, unspecified cervical region: Secondary | ICD-10-CM

## 2021-03-31 DIAGNOSIS — R29898 Other symptoms and signs involving the musculoskeletal system: Secondary | ICD-10-CM
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
## 2021-04-05 ENCOUNTER — Encounter (HOSPITAL_COMMUNITY): Payer: BC Managed Care – PPO | Admitting: Physical Therapy

## 2021-04-07 ENCOUNTER — Ambulatory Visit (HOSPITAL_COMMUNITY): Payer: BC Managed Care – PPO | Admitting: Physical Therapy

## 2021-04-07 ENCOUNTER — Encounter (HOSPITAL_COMMUNITY): Payer: Self-pay | Admitting: Physical Therapy

## 2021-04-07 ENCOUNTER — Encounter (HOSPITAL_COMMUNITY): Payer: BC Managed Care – PPO | Admitting: Physical Therapy

## 2021-04-07 DIAGNOSIS — R29898 Other symptoms and signs involving the musculoskeletal system: Secondary | ICD-10-CM

## 2021-04-07 DIAGNOSIS — Z789 Other specified health status: Secondary | ICD-10-CM

## 2021-04-07 DIAGNOSIS — M5030 Other cervical disc degeneration, unspecified cervical region: Secondary | ICD-10-CM

## 2021-04-07 DIAGNOSIS — M5136 Other intervertebral disc degeneration, lumbar region: Secondary | ICD-10-CM | POA: Diagnosis not present
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
## 2021-04-12 ENCOUNTER — Ambulatory Visit (HOSPITAL_COMMUNITY): Payer: BC Managed Care – PPO | Attending: Orthopedic Surgery | Admitting: Physical Therapy

## 2021-04-12 ENCOUNTER — Encounter (HOSPITAL_COMMUNITY): Payer: Self-pay | Admitting: Physical Therapy

## 2021-04-12 DIAGNOSIS — M5030 Other cervical disc degeneration, unspecified cervical region: Secondary | ICD-10-CM | POA: Insufficient documentation

## 2021-04-12 DIAGNOSIS — Y939 Activity, unspecified: Secondary | ICD-10-CM | POA: Insufficient documentation

## 2021-04-12 DIAGNOSIS — R29898 Other symptoms and signs involving the musculoskeletal system: Secondary | ICD-10-CM | POA: Insufficient documentation

## 2021-04-12 DIAGNOSIS — Z789 Other specified health status: Secondary | ICD-10-CM | POA: Insufficient documentation

## 2021-04-12 DIAGNOSIS — M5136 Other intervertebral disc degeneration, lumbar region: Secondary | ICD-10-CM | POA: Diagnosis not present
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
## 2021-04-14 ENCOUNTER — Encounter (HOSPITAL_COMMUNITY): Payer: BC Managed Care – PPO | Admitting: Physical Therapy

## 2021-04-18 ENCOUNTER — Ambulatory Visit: Payer: BC Managed Care – PPO | Admitting: Orthopedic Surgery
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
## 2021-06-30 ENCOUNTER — Encounter: Payer: Self-pay | Admitting: *Deleted

## 2021-08-31 ENCOUNTER — Emergency Department (HOSPITAL_COMMUNITY): Payer: Medicaid Other

## 2021-08-31 ENCOUNTER — Emergency Department (HOSPITAL_COMMUNITY)
Admission: EM | Admit: 2021-08-31 | Discharge: 2021-08-31 | Disposition: A | Discharge status: Home / Self Care | Payer: Medicaid Other | Attending: Emergency Medicine | Admitting: Emergency Medicine

## 2021-08-31 ENCOUNTER — Other Ambulatory Visit: Payer: Self-pay

## 2021-08-31 ENCOUNTER — Encounter (HOSPITAL_COMMUNITY): Payer: Self-pay | Admitting: *Deleted

## 2021-08-31 DIAGNOSIS — R6884 Jaw pain: Secondary | ICD-10-CM | POA: Diagnosis not present

## 2021-08-31 DIAGNOSIS — S0993XA Unspecified injury of face, initial encounter: Secondary | ICD-10-CM | POA: Diagnosis not present

## 2021-08-31 MED ORDER — NAPROXEN 500 MG PO TABS: 500.0000 mg | ORAL_TABLET | Freq: Two times a day (BID) | ORAL | 0 refills | Status: AC

## 2021-08-31 NOTE — Discharge Instructions (Signed)
Follow-up with your dentist in 1 to 2 weeks if not improving
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
## 2022-03-09 ENCOUNTER — Encounter: Payer: Self-pay | Admitting: Radiology
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
